# Patient Record
Sex: Male | Born: 1990
Health system: Southern US, Community
[De-identification: ages and names within clinical notes are randomized; demographics above are authoritative.]

## PROBLEM LIST (undated history)

## (undated) DIAGNOSIS — R945 Abnormal results of liver function studies: Secondary | ICD-10-CM

## (undated) DIAGNOSIS — F32A Depression, unspecified: Secondary | ICD-10-CM

## (undated) DIAGNOSIS — R7989 Other specified abnormal findings of blood chemistry: Secondary | ICD-10-CM

## (undated) DIAGNOSIS — F329 Major depressive disorder, single episode, unspecified: Secondary | ICD-10-CM

## (undated) HISTORY — PX: SHOULDER ARTHROSCOPY W/ LABRAL REPAIR: SHX2399

## (undated) HISTORY — DX: Abnormal results of liver function studies: R94.5

## (undated) HISTORY — DX: Depression, unspecified: F32.A

## (undated) HISTORY — PX: WISDOM TOOTH EXTRACTION: SHX21

## (undated) HISTORY — DX: Major depressive disorder, single episode, unspecified: F32.9

## (undated) HISTORY — DX: Other specified abnormal findings of blood chemistry: R79.89

---
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
## 2012-10-12 ENCOUNTER — Other Ambulatory Visit: Payer: Self-pay | Admitting: Nurse Practitioner

## 2012-10-15 NOTE — Telephone Encounter (Signed)
Last seen 07/13

## 2012-12-06 ENCOUNTER — Ambulatory Visit (INDEPENDENT_AMBULATORY_CARE_PROVIDER_SITE_OTHER): Payer: 59 | Admitting: Nurse Practitioner

## 2012-12-06 ENCOUNTER — Encounter: Payer: Self-pay | Admitting: Nurse Practitioner

## 2012-12-06 VITALS — BP 148/99 | HR 92 | Temp 97.5°F | Ht 70.0 in | Wt 263.0 lb

## 2012-12-06 DIAGNOSIS — I10 Essential (primary) hypertension: Secondary | ICD-10-CM

## 2012-12-06 DIAGNOSIS — Z0000 Encounter for general adult medical examination without abnormal findings: Secondary | ICD-10-CM

## 2012-12-06 DIAGNOSIS — F32A Depression, unspecified: Secondary | ICD-10-CM

## 2012-12-06 DIAGNOSIS — F329 Major depressive disorder, single episode, unspecified: Secondary | ICD-10-CM

## 2012-12-06 MED ORDER — SERTRALINE HCL 100 MG PO TABS: 100.0000 mg | ORAL_TABLET | Freq: Every day | ORAL | Status: DC

## 2012-12-06 MED ORDER — HYDROCHLOROTHIAZIDE 12.5 MG PO CAPS: 12.5000 mg | ORAL_CAPSULE | Freq: Every day | ORAL | Status: DC

## 2012-12-06 NOTE — Progress Notes (Signed)
  Subjective:    Patient ID: Michael Roman, male    DOB: [birth year], 22 y.o.   MRN: [id number]  HPI  Patient in today for CPE- He is doing quite well- Blood pressure slightly elevated and he says it has been high the last couple of times that he checked it.    Review of Systems  Constitutional: Negative for fever, activity change, appetite change and fatigue.  HENT: Negative.   Eyes: Negative.   Respiratory: Negative for cough, chest tightness and shortness of breath.   Cardiovascular: Negative for chest pain, palpitations and leg swelling.  Gastrointestinal: Negative.   Endocrine: Negative.   Genitourinary: Negative.   Skin:       Nevus on face mom wants looked at.  Neurological: Negative.   Hematological: Negative.   Psychiatric/Behavioral: Negative.        Objective:   Physical Exam  Constitutional: He is oriented to person, place, and time. He appears well-developed and well-nourished.  HENT:  Head: Normocephalic.  Right Ear: External ear normal.  Left Ear: External ear normal.  Nose: Nose normal.  Mouth/Throat: Oropharynx is clear and moist.  Eyes: Conjunctivae and EOM are normal. Pupils are equal, round, and reactive to light.  Neck: Normal range of motion. Neck supple. No thyromegaly present.  Cardiovascular: Normal rate, regular rhythm, normal heart sounds and intact distal pulses.   No murmur heard. Pulmonary/Chest: Effort normal and breath sounds normal. He has no wheezes. He has no rales.  Abdominal: Soft. Bowel sounds are normal.  Musculoskeletal: Normal range of motion.  Lymphadenopathy:    He has no cervical adenopathy.  Neurological: He is alert and oriented to person, place, and time.  Skin: Skin is warm and dry.  Small black nevus on right cheek  Psychiatric: He has a normal mood and affect. His behavior is normal. Judgment and thought content normal.    BP 148/99  Pulse 92  Temp(Src) 97.5 F (36.4 C) (Oral)  Ht 5' 10"  (1.778 m)  Wt 263 lb (119.296  kg)  BMI 37.74 kg/m2       Assessment & Plan:  1. Annual physical exam Diet and exercise encouraged  2. Depression Stress management Continue zoloft as rx  3. Hypertension Low NA diet HCTZ 12.5 1 po qd #30 5 refills  Mary-Margaret Hassell Done, FNP

## 2012-12-06 NOTE — Patient Instructions (Addendum)

## 2012-12-08 LAB — NMR, LIPOPROFILE
LDL Particle Number: 2083 nmol/L — ABNORMAL HIGH (ref <1000)
LDL Size: 20.2 nm — ABNORMAL LOW (ref >20.5)
LP-IR Score: 100 — ABNORMAL HIGH (ref <=45)

## 2012-12-08 LAB — CMP14+EGFR
ALT: 94 IU/L — ABNORMAL HIGH (ref 0–44)
AST: 61 IU/L — ABNORMAL HIGH (ref 0–40)
Alkaline Phosphatase: 63 IU/L (ref 39–117)
BUN/Creatinine Ratio: 13 (ref 8–19)
Chloride: 105 mmol/L (ref 97–108)
GFR calc Af Amer: 131 mL/min/{1.73_m2} (ref >59)
Glucose: 89 mg/dL (ref 65–99)
Potassium: 4.3 mmol/L (ref 3.5–5.2)
Sodium: 143 mmol/L (ref 134–144)
Total Bilirubin: 0.4 mg/dL (ref 0.0–1.2)
Total Protein: 7.7 g/dL (ref 6.0–8.5)

## 2013-01-31 ENCOUNTER — Other Ambulatory Visit: Payer: Self-pay | Admitting: Nurse Practitioner

## 2013-01-31 ENCOUNTER — Telehealth: Payer: Self-pay | Admitting: Nurse Practitioner

## 2013-01-31 DIAGNOSIS — F329 Major depressive disorder, single episode, unspecified: Secondary | ICD-10-CM

## 2013-01-31 DIAGNOSIS — F32A Depression, unspecified: Secondary | ICD-10-CM

## 2013-01-31 MED ORDER — SERTRALINE HCL 100 MG PO TABS: 100.0000 mg | ORAL_TABLET | Freq: Every day | ORAL | Status: DC

## 2013-01-31 NOTE — Telephone Encounter (Signed)
Get pharmacy address so i can send eletronic rx there

## 2013-01-31 NOTE — Telephone Encounter (Signed)
rx sent electronically. 

## 2013-01-31 NOTE — Telephone Encounter (Signed)
Sent in [place] 10/06

## 2013-01-31 NOTE — Telephone Encounter (Signed)
[address]

## 2013-03-03 ENCOUNTER — Other Ambulatory Visit: Payer: Self-pay

## 2013-05-11 ENCOUNTER — Other Ambulatory Visit: Payer: Self-pay | Admitting: Nurse Practitioner

## 2013-05-12 ENCOUNTER — Telehealth: Payer: Self-pay | Admitting: Nurse Practitioner

## 2013-05-12 DIAGNOSIS — F32A Depression, unspecified: Secondary | ICD-10-CM

## 2013-05-12 DIAGNOSIS — F329 Major depressive disorder, single episode, unspecified: Secondary | ICD-10-CM

## 2013-05-12 MED ORDER — SERTRALINE HCL 100 MG PO TABS: ORAL_TABLET | ORAL | Status: DC

## 2013-05-12 NOTE — Telephone Encounter (Signed)
rx corrected

## 2014-05-08 ENCOUNTER — Other Ambulatory Visit: Payer: Self-pay | Admitting: Nurse Practitioner

## 2014-05-10 ENCOUNTER — Telehealth: Payer: Self-pay | Admitting: *Deleted

## 2014-05-10 DIAGNOSIS — F32A Depression, unspecified: Secondary | ICD-10-CM

## 2014-05-10 DIAGNOSIS — F329 Major depressive disorder, single episode, unspecified: Secondary | ICD-10-CM

## 2014-05-10 MED ORDER — SERTRALINE HCL 100 MG PO TABS: ORAL_TABLET | ORAL | Status: DC

## 2014-05-10 NOTE — Telephone Encounter (Signed)
Patient is away at college and will need refills to last until he can come in during spring break.  One refill provided and mother will call back when she has his schedule.

## 2014-06-12 ENCOUNTER — Other Ambulatory Visit: Payer: Self-pay | Admitting: Nurse Practitioner

## 2014-07-03 ENCOUNTER — Encounter: Payer: Self-pay | Admitting: Nurse Practitioner

## 2014-07-03 ENCOUNTER — Ambulatory Visit (INDEPENDENT_AMBULATORY_CARE_PROVIDER_SITE_OTHER): Payer: 59 | Admitting: Nurse Practitioner

## 2014-07-03 VITALS — BP 130/86 | HR 94 | Temp 98.0°F | Ht 70.0 in | Wt 254.0 lb

## 2014-07-03 DIAGNOSIS — F411 Generalized anxiety disorder: Secondary | ICD-10-CM | POA: Diagnosis not present

## 2014-07-03 MED ORDER — SERTRALINE HCL 100 MG PO TABS: ORAL_TABLET | ORAL | Status: DC

## 2014-07-03 NOTE — Patient Instructions (Signed)
Generalized Anxiety Disorder Generalized anxiety disorder (GAD) is a mental disorder. It interferes with life functions, including relationships, work, and school. GAD is different from normal anxiety, which everyone experiences at some point in their lives in response to specific life events and activities. Normal anxiety actually helps us prepare for and get through these life events and activities. Normal anxiety goes away after the event or activity is over.  GAD causes anxiety that is not necessarily related to specific events or activities. It also causes excess anxiety in proportion to specific events or activities. The anxiety associated with GAD is also difficult to control. GAD can vary from mild to severe. People with severe GAD can have intense waves of anxiety with physical symptoms (panic attacks).  SYMPTOMS The anxiety and worry associated with GAD are difficult to control. This anxiety and worry are related to many life events and activities and also occur more days than not for 6 months or longer. People with GAD also have three or more of the following symptoms (one or more in children):  Restlessness.   Fatigue.  Difficulty concentrating.   Irritability.  Muscle tension.  Difficulty sleeping or unsatisfying sleep. DIAGNOSIS GAD is diagnosed through an assessment by your health care provider. Your health care provider will ask you questions aboutyour mood,physical symptoms, and events in your life. Your health care provider may ask you about your medical history and use of alcohol or drugs, including prescription medicines. Your health care provider may also do a physical exam and blood tests. Certain medical conditions and the use of certain substances can cause symptoms similar to those associated with GAD. Your health care provider may refer you to a mental health specialist for further evaluation. TREATMENT The following therapies are usually used to treat GAD:    Medication. Antidepressant medication usually is prescribed for long-term daily control. Antianxiety medicines may be added in severe cases, especially when panic attacks occur.   Talk therapy (psychotherapy). Certain types of talk therapy can be helpful in treating GAD by providing support, education, and guidance. A form of talk therapy called cognitive behavioral therapy can teach you healthy ways to think about and react to daily life events and activities.  Stress managementtechniques. These include yoga, meditation, and exercise and can be very helpful when they are practiced regularly. A mental health specialist can help determine which treatment is best for you. Some people see improvement with one therapy. However, other people require a combination of therapies. Document Released: 08/09/2012 Document Revised: 08/29/2013 Document Reviewed: 08/09/2012 ExitCare Patient Information 2015 [employer]. This information is not intended to replace advice given to you by your health care provider. Make sure you discuss any questions you have with your health care provider.  

## 2014-07-03 NOTE — Progress Notes (Signed)
   Subjective:    Patient ID: Michael Roman, male    DOB: 11/04/1990, 24 y.o.   MRN: [id number]  HPI Patient is here for anxiety follow up. He is currently taking zoloft 116m daily and is doing well. No side effects reported.    Review of Systems  Constitutional: Negative.   HENT: Negative.   Eyes: Negative.   Respiratory: Negative.   Cardiovascular: Negative.   Gastrointestinal: Negative.   Endocrine: Negative.   Genitourinary: Negative.   Musculoskeletal: Negative.   Skin: Negative.   Allergic/Immunologic: Negative.   Neurological: Negative.   Hematological: Negative.   Psychiatric/Behavioral: Negative.        Objective:   Physical Exam  Constitutional: He is oriented to person, place, and time. He appears well-developed.  HENT:  Head: Normocephalic.  Eyes: Pupils are equal, round, and reactive to light.  Neck: Normal range of motion.  Cardiovascular: Normal rate.   Pulmonary/Chest: Effort normal.  Musculoskeletal: Normal range of motion.  Neurological: He is alert and oriented to person, place, and time.  Skin: Skin is warm.  Psychiatric: He has a normal mood and affect.    BP 130/86 mmHg  Pulse 94  Temp(Src) 98 F (36.7 C) (Oral)  Ht 5' 10"  (1.778 m)  Wt 254 lb (115.214 kg)  BMI 36.45 kg/m2       Assessment & Plan:   1. Generalized anxiety disorder    Meds ordered this encounter  Medications  . sertraline (ZOLOFT) 100 MG tablet    Sig: TAKE 1 & 1/2 TABLETS ONCE DAILY    Dispense:  45 tablet    Refill:  3    Needs to be seen. Has appt    Order Specific Question:  Supervising Provider    Answer:  MChipper Herb[[id number]]   Stress management Diet and exercise   Mary-Margaret MHassell Done FNP

## 2014-11-14 ENCOUNTER — Other Ambulatory Visit: Payer: Self-pay | Admitting: Nurse Practitioner

## 2014-12-11 ENCOUNTER — Other Ambulatory Visit: Payer: Self-pay | Admitting: Nurse Practitioner

## 2015-01-16 ENCOUNTER — Other Ambulatory Visit: Payer: Self-pay | Admitting: Nurse Practitioner

## 2015-01-16 NOTE — Telephone Encounter (Signed)
Last seen 07/03/14 MMM

## 2015-02-21 ENCOUNTER — Other Ambulatory Visit: Payer: Self-pay | Admitting: Nurse Practitioner

## 2015-02-21 NOTE — Telephone Encounter (Signed)
Last seen 07/03/14 MMM

## 2015-03-31 ENCOUNTER — Other Ambulatory Visit: Payer: Self-pay | Admitting: Nurse Practitioner

## 2015-04-02 NOTE — Telephone Encounter (Signed)
Last seen 07/03/14  MMM

## 2015-05-11 ENCOUNTER — Other Ambulatory Visit: Payer: Self-pay | Admitting: Nurse Practitioner

## 2015-07-06 ENCOUNTER — Other Ambulatory Visit: Payer: Self-pay | Admitting: Nurse Practitioner

## 2015-07-06 NOTE — Telephone Encounter (Signed)
Been a year since last seen

## 2015-07-16 ENCOUNTER — Ambulatory Visit (INDEPENDENT_AMBULATORY_CARE_PROVIDER_SITE_OTHER): Payer: 59 | Admitting: Family Medicine

## 2015-07-16 ENCOUNTER — Encounter: Payer: Self-pay | Admitting: Family Medicine

## 2015-07-16 VITALS — BP 140/88 | HR 87 | Temp 99.7°F | Ht 70.0 in | Wt 258.0 lb

## 2015-07-16 DIAGNOSIS — F410 Panic disorder [episodic paroxysmal anxiety] without agoraphobia: Secondary | ICD-10-CM | POA: Diagnosis not present

## 2015-07-16 MED ORDER — HYDROXYZINE HCL 25 MG PO TABS: 25.0000 mg | ORAL_TABLET | Freq: Three times a day (TID) | ORAL | Status: DC | PRN

## 2015-07-16 NOTE — Progress Notes (Signed)
BP 140/88 mmHg  Pulse 87  Temp(Src) 99.7 F (37.6 C) (Oral)  Ht 5' 10"  (1.778 m)  Wt 258 lb (117.028 kg)  BMI 37.02 kg/m2   Subjective:    Patient ID: Michael Roman, male    DOB: 1991/01/09, 25 y.o.   MRN: [id number]  HPI: Michael Roman is a 25 y.o. male presenting on 07/16/2015 for Anxiety   HPI Panic attack Patient comes in today because of his anxiety caused him to have a panic attack. 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 The test did not go well and last night he had a lot of anxiety associated with that and it built up to where he had a panic attack overnight last night and into this morning. He says also hasn't helped because his parents been out of town this weekend so he has more time to be inside his own head. He says it is been years since he has had a panic attack like this. He has been steady on the Zoloft for quite a while. He denies any suicidal ideations or thoughts of hurting himself. He is feeling better now but is just concerned about having another panic attack. His parents do get back tomorrow.  Relevant past medical, surgical, family and social history reviewed and updated as indicated. Interim medical history since our last visit reviewed. Allergies and medications reviewed and updated.  Review of Systems  Constitutional: Negative for fever.  HENT: Negative for ear discharge and ear pain.   Eyes: Negative for discharge and visual disturbance.  Respiratory: Positive for chest tightness. Negative for shortness of breath and wheezing.   Cardiovascular: Positive for palpitations. Negative for chest pain and leg swelling.  Gastrointestinal: Negative for abdominal pain, diarrhea and constipation.  Genitourinary: Negative for difficulty urinating.  Musculoskeletal: Negative for back pain and gait problem.  Skin: Negative for rash.  Neurological: Positive for dizziness and headaches. Negative for syncope and  light-headedness.  All other systems reviewed and are negative.   Per HPI unless specifically indicated above     Medication List       This list is accurate as of: 07/16/15  4:35 PM.  Always use your most recent med list.               hydrochlorothiazide 12.5 MG capsule  Commonly known as:  MICROZIDE  Take 1 capsule (12.5 mg total) by mouth daily.     hydrOXYzine 25 MG tablet  Commonly known as:  ATARAX/VISTARIL  Take 1 tablet (25 mg total) by mouth 3 (three) times daily as needed.     sertraline 100 MG tablet  Commonly known as:  ZOLOFT  TAKE 1 & 1/2 TABLETS ONCE DAILY           Objective:    BP 140/88 mmHg  Pulse 87  Temp(Src) 99.7 F (37.6 C) (Oral)  Ht 5' 10"  (1.778 m)  Wt 258 lb (117.028 kg)  BMI 37.02 kg/m2  Wt Readings from Last 3 Encounters:  07/16/15 258 lb (117.028 kg)  07/03/14 254 lb (115.214 kg)  12/06/12 263 lb (119.296 kg)    Physical Exam  Constitutional: He is oriented to person, place, and time. He appears well-developed and well-nourished. No distress.  HENT:  Right Ear: External ear normal.  Left Ear: External ear normal.  Nose: Nose normal.  Mouth/Throat: Oropharynx is clear and moist. No oropharyngeal exudate.  Eyes: Conjunctivae and EOM are normal. Pupils are  equal, round, and reactive to light. Right eye exhibits no discharge. No scleral icterus.  Neck: Neck supple. No thyromegaly present.  Cardiovascular: Normal rate, regular rhythm, normal heart sounds and intact distal pulses.   No murmur heard. Pulmonary/Chest: Effort normal and breath sounds normal. No respiratory distress. He has no wheezes.  Musculoskeletal: Normal range of motion. He exhibits no edema.  Lymphadenopathy:    He has no cervical adenopathy.  Neurological: He is alert and oriented to person, place, and time. No cranial nerve deficit. Coordination normal.  Skin: Skin is warm and dry. No rash noted. He is not diaphoretic.  Psychiatric: He has a normal mood and  affect. His behavior is normal.  Vitals reviewed.      Assessment & Plan:       Problem List Items Addressed This Visit    None    Visit Diagnoses    Panic attack    -  Primary    Patient has had stable anxiety for quite a few years, had a panic attack yesterday, gave Vistaril and have follow-up with PCP    Relevant Medications    hydrOXYzine (ATARAX/VISTARIL) 25 MG tablet        Follow up plan: Return in about 4 weeks (around 08/13/2015), or if symptoms worsen or fail to improve, for Follow-up anxiety.  Counseling provided for all of the vaccine components No orders of the defined types were placed in this encounter.    Caryl Pina, MD [facility] 07/16/2015, 4:35 PM

## 2015-07-20 ENCOUNTER — Ambulatory Visit (INDEPENDENT_AMBULATORY_CARE_PROVIDER_SITE_OTHER): Payer: 59 | Admitting: Psychology

## 2015-08-03 ENCOUNTER — Ambulatory Visit (HOSPITAL_COMMUNITY): Payer: Self-pay | Admitting: Psychology

## 2015-08-14 ENCOUNTER — Other Ambulatory Visit: Payer: Self-pay | Admitting: Nurse Practitioner

## 2015-08-15 ENCOUNTER — Ambulatory Visit (INDEPENDENT_AMBULATORY_CARE_PROVIDER_SITE_OTHER): Payer: 59 | Admitting: Psychology

## 2015-09-03 ENCOUNTER — Ambulatory Visit (INDEPENDENT_AMBULATORY_CARE_PROVIDER_SITE_OTHER): Payer: 59 | Admitting: Family Medicine

## 2015-09-03 ENCOUNTER — Encounter: Payer: Self-pay | Admitting: Family Medicine

## 2015-09-03 VITALS — BP 137/96 | HR 96 | Temp 97.8°F | Ht 70.0 in | Wt 255.4 lb

## 2015-09-03 DIAGNOSIS — Z0000 Encounter for general adult medical examination without abnormal findings: Secondary | ICD-10-CM

## 2015-09-03 NOTE — Progress Notes (Signed)
   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  Patient explains that he feels well. 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  He has no complaints. He is nervous about the exam today and states that he gets nervous every time he comes to the doctor.  He is doing well from a generalized anxiety disorder perspective, denies suicidal ideation and has good medication compliance.  He is not taking his antihypertensives, he denies any chest pain, headache, palpitations, or leg edema  PMH: Smoking status noted ROS: Per HPI  Objective: BP 137/96 mmHg  Pulse 96  Temp(Src) 97.8 F (36.6 C) (Oral)  Ht 5' 10"  (1.778 m)  Wt 255 lb 6.4 oz (115.849 kg)  BMI 36.65 kg/m2 Gen: NAD, alert, cooperative with exam HEENT: NCAT, EOMI, PERRL, nares clear, TMs normal, oropharynx clear CV: RRR, good S1/S2, no murmur Resp: CTABL, no wheezes, non-labored Abd: SNTND, BS present, no guarding or organomegaly Ext: No edema, warm Neuro: Alert and oriented, 5/5 and sensation intact in bilateral lower and upper extremities, 2+ patellar tendon reflexes  Musculoskeletal Normal range of motion of neck, shoulders, elbows, hips, knees, and ankles  Assessment and plan:  # Annual physical exam, 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 09/03/2015, 4:13 PM

## 2015-09-03 NOTE — Patient Instructions (Signed)
Great to meet you!  Come back as previously planned for your anxiety.

## 2015-09-05 ENCOUNTER — Ambulatory Visit (HOSPITAL_COMMUNITY): Payer: Self-pay | Admitting: Psychology

## 2015-10-04 ENCOUNTER — Ambulatory Visit (INDEPENDENT_AMBULATORY_CARE_PROVIDER_SITE_OTHER): Payer: 59 | Admitting: Family Medicine

## 2015-10-04 ENCOUNTER — Encounter: Payer: Self-pay | Admitting: Family Medicine

## 2015-10-04 VITALS — BP 148/87 | HR 94 | Temp 96.3°F | Ht 70.0 in | Wt 258.4 lb

## 2015-10-04 DIAGNOSIS — F411 Generalized anxiety disorder: Secondary | ICD-10-CM

## 2015-10-04 MED ORDER — SERTRALINE HCL 100 MG PO TABS: ORAL_TABLET | ORAL | Status: DC

## 2015-10-04 NOTE — Progress Notes (Signed)
   HPI  Patient presents today for follow-up for anxiety.  Patient states that he is doing very well and has no complaints.  He denies feelings of depression, anhedonia, and anxious feelings.  He's been doing very well on Zoloft for several years.  He takes 150 mg daily.  He denies any shortness of breath, chest pain, upset stomach. He has no problems concentrating or sleeping. 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 sertraline (ZOLOFT) 100 MG tablet    Sig: TAKE 1 & 1/2 TABLETS ONCE DAILY    Dispense:  45 tablet    Refill:  [place], MD [facility] 10/04/2015, 1:11 PM

## 2015-10-04 NOTE — Patient Instructions (Signed)
Great to see you!  I have sent in 6 months worth of meds, Lets plan to follow up in December.   Please let us know if you need anything at all.

## 2016-02-22 ENCOUNTER — Telehealth: Payer: 59 | Admitting: Nurse Practitioner

## 2016-02-22 DIAGNOSIS — J029 Acute pharyngitis, unspecified: Secondary | ICD-10-CM

## 2016-02-22 NOTE — Progress Notes (Signed)
We are sorry that you are not feeling well.  Here is how we plan to help!  Based on what you have shared with me it looks like you have viral pharyngitis.  viral pharyngitis is inflammation and infection in the back of the throat.  This is an infection caused by a virus and is treated symptomatically.. You may use an oral throat lozenges as needed. Viral pharyngitis are not as easily transmitted as other respiratory infection, however we still recommend that you avoid close contact with loved ones, especially the very young and elderly.  Remember to wash your hands thoroughly throughout the day as this is the number one way to prevent the spread of infection!  Home Care: Only take medications as instructed by your medical team. Complete the entire course of an antibiotic. Do not take these medications with alcohol. A steam or ultrasonic humidifier can help congestion.  You can place a towel over your head and breathe in the steam from hot water coming from a faucet. Avoid close contacts especially the very young and the elderly. Cover your mouth when you cough or sneeze. Always remember to wash your hands.  Get Help Right Away If: You develop worsening fever or sinus pain. You develop a severe head ache or visual changes. Your symptoms persist after you have completed your treatment plan.  Make sure you Understand these instructions. Will watch your condition. Will get help right away if you are not doing well or get worse.  Your e-visit answers were reviewed by a board certified advanced clinical practitioner to complete your personal care plan.  Depending on the condition, your plan could have included both over the counter or prescription medications.  If there is a problem please reply  once you have received a response from your provider.  Your safety is important to [place].  If you have drug allergies check your prescription carefully.    You can use MyChart to ask questions about  today's visit, request a non-urgent call back, or ask for a work or school excuse for 24 hours related to this e-Visit. If it has been greater than 24 hours you will need to follow up with your provider, or enter a new e-Visit to address those concerns.  You will get an e-mail in the next two days asking about your experience.  I hope that your e-visit has been valuable and will speed your recovery. Thank you for using e-visits.

## 2016-04-15 ENCOUNTER — Other Ambulatory Visit: Payer: Self-pay | Admitting: Family Medicine

## 2016-05-09 ENCOUNTER — Other Ambulatory Visit: Payer: Self-pay | Admitting: Nurse Practitioner

## 2016-06-20 ENCOUNTER — Other Ambulatory Visit: Payer: Self-pay | Admitting: Nurse Practitioner

## 2016-06-21 ENCOUNTER — Other Ambulatory Visit: Payer: Self-pay | Admitting: *Deleted

## 2016-06-21 MED ORDER — SERTRALINE HCL 100 MG PO TABS: ORAL_TABLET | ORAL | 0 refills | Status: DC

## 2016-06-21 NOTE — Progress Notes (Signed)
Pt requesting refill on Zoloft Sent in 1 mth refill appt scheduled for follow up with Dr Wendi Snipes

## 2016-06-23 ENCOUNTER — Ambulatory Visit: Payer: 59 | Admitting: Family Medicine

## 2016-06-24 ENCOUNTER — Telehealth: Payer: Self-pay | Admitting: Nurse Practitioner

## 2016-06-24 ENCOUNTER — Encounter: Payer: Self-pay | Admitting: Nurse Practitioner

## 2016-06-24 ENCOUNTER — Other Ambulatory Visit: Payer: Self-pay | Admitting: Nurse Practitioner

## 2016-06-24 NOTE — Telephone Encounter (Signed)
Left message for patient to call back to reschedule missed appointment.  

## 2016-07-03 ENCOUNTER — Ambulatory Visit (INDEPENDENT_AMBULATORY_CARE_PROVIDER_SITE_OTHER): Payer: BLUE CROSS/BLUE SHIELD | Admitting: Family Medicine

## 2016-07-03 ENCOUNTER — Encounter: Payer: Self-pay | Admitting: Family Medicine

## 2016-07-03 VITALS — BP 139/93 | HR 100 | Temp 97.5°F | Ht 70.0 in | Wt 260.8 lb

## 2016-07-03 DIAGNOSIS — F411 Generalized anxiety disorder: Secondary | ICD-10-CM

## 2016-07-03 DIAGNOSIS — R21 Rash and other nonspecific skin eruption: Secondary | ICD-10-CM

## 2016-07-03 DIAGNOSIS — E785 Hyperlipidemia, unspecified: Secondary | ICD-10-CM | POA: Diagnosis not present

## 2016-07-03 DIAGNOSIS — I10 Essential (primary) hypertension: Secondary | ICD-10-CM | POA: Diagnosis not present

## 2016-07-03 MED ORDER — KETOCONAZOLE 2 % EX CREA: 1.0000 "application " | TOPICAL_CREAM | Freq: Every day | CUTANEOUS | 2 refills | Status: DC

## 2016-07-03 MED ORDER — SERTRALINE HCL 100 MG PO TABS: ORAL_TABLET | ORAL | 3 refills | Status: DC

## 2016-07-03 NOTE — Patient Instructions (Signed)
Great to see you!  Lets follow up in 6 month sunless you need [place] sooner.   We will call with labs or send then to [place] within 1 week.

## 2016-07-03 NOTE — Progress Notes (Signed)
   HPI  Patient presents today to follow-up for chronic medical conditions.  Anxiety Well-controlled with 150 mg Zoloft daily Denies suicidal thoughts or uncontrolled symptoms.  Hyperlipidemia Patient recently with very high cholesterol, at the time he states he had a very poor lifestyle in college with unhealthy eating habits and elevated alcohol use. He does not have regular exercise currently.  Elevated blood pressure Previously treated with HCTZ, however he states his blood pressures usually better whenever he is not a doctor's office.   PMH: Smoking status noted ROS: Per HPI  Objective: BP (!) 139/93   Pulse 100   Temp 97.5 F (36.4 C) (Oral)   Ht _0  (1.778 m)   Wt 260 lb 12.8 oz (118.3 kg)   BMI 37.42 kg/m  Gen: NAD, alert, cooperative with exam HEENT: NCAT, EOMI, PERRL CV: RRR, good S1/S2, no murmur Resp: CTABL, no wheezes, non-labored Abd: SNTND, BS present, no guarding or organomegaly Ext: No edema, warm Neuro: Alert and oriented, No gross deficits  Skin 10-15 slightly erythematous to pink finely scaled coin shaped lesions on the bilateral forearms and left lower leg  Assessment and plan:  # Generalized anxiety disorder Doing well, refilled Zoloft- stable  # Elevated blood pressure without diagnosis of hypertension Blood pressure log, follow-up as needed  # Hyperlipidemia Clinically improved, recheck labs some concern with elevated liver enzymes  # Rash Likely tinea corporis, improved with terbinafine orally from urgent care ketoconazole ordered, would be glad to do another course of terbinafine but I like to verify normal liver enzymes first    Orders Placed This Encounter  Procedures  . Lipid panel  . CMP14+EGFR  . CBC with Differential/Platelet  . TSH    Meds ordered this encounter  Medications  . sertraline (ZOLOFT) 100 MG tablet    Sig: TAKE 1 & 1/2 TABLETS ONCE DAILY    Dispense:  135 tablet    Refill:  [place],  MD [place] 07/03/2016, 8:52 AM

## 2016-07-04 ENCOUNTER — Other Ambulatory Visit: Payer: Self-pay

## 2016-07-04 DIAGNOSIS — R748 Abnormal levels of other serum enzymes: Secondary | ICD-10-CM

## 2016-07-04 LAB — LIPID PANEL
CHOL/HDL RATIO: 6.2 ratio — AB (ref 0.0–5.0)
Cholesterol, Total: 185 mg/dL (ref 100–199)
HDL: 30 mg/dL — ABNORMAL LOW (ref >39)
LDL CALC: 111 mg/dL — AB (ref 0–99)
Triglycerides: 218 mg/dL — ABNORMAL HIGH (ref 0–149)
VLDL CHOLESTEROL CAL: 44 mg/dL — AB (ref 5–40)

## 2016-07-04 LAB — CBC WITH DIFFERENTIAL/PLATELET
Basophils Absolute: 0.1 10*3/uL (ref 0.0–0.2)
Basos: 1 %
EOS (ABSOLUTE): 0.3 10*3/uL (ref 0.0–0.4)
EOS: 5 %
HEMATOCRIT: 44.9 % (ref 37.5–51.0)
Hemoglobin: 15.9 g/dL (ref 13.0–17.7)
Immature Grans (Abs): 0 10*3/uL (ref 0.0–0.1)
Immature Granulocytes: 0 %
LYMPHS ABS: 2.9 10*3/uL (ref 0.7–3.1)
Lymphs: 38 %
MCH: 30.4 pg (ref 26.6–33.0)
MCHC: 35.4 g/dL (ref 31.5–35.7)
MCV: 86 fL (ref 79–97)
MONOCYTES: 7 %
MONOS ABS: 0.5 10*3/uL (ref 0.1–0.9)
Neutrophils Absolute: 3.7 10*3/uL (ref 1.4–7.0)
Neutrophils: 49 %
Platelets: 312 10*3/uL (ref 150–379)
RBC: 5.23 x10E6/uL (ref 4.14–5.80)
RDW: 14.1 % (ref 12.3–15.4)
WBC: 7.5 10*3/uL (ref 3.4–10.8)

## 2016-07-04 LAB — CMP14+EGFR
ALK PHOS: 53 IU/L (ref 39–117)
ALT: 71 IU/L — ABNORMAL HIGH (ref 0–44)
AST: 42 IU/L — AB (ref 0–40)
Albumin/Globulin Ratio: 1.6 (ref 1.2–2.2)
Albumin: 4.9 g/dL (ref 3.5–5.5)
BILIRUBIN TOTAL: 0.4 mg/dL (ref 0.0–1.2)
BUN / CREAT RATIO: 12 (ref 9–20)
BUN: 12 mg/dL (ref 6–20)
CHLORIDE: 103 mmol/L (ref 96–106)
CO2: 25 mmol/L (ref 18–29)
CREATININE: 1 mg/dL (ref 0.76–1.27)
Calcium: 9.8 mg/dL (ref 8.7–10.2)
GFR calc Af Amer: 122 mL/min/{1.73_m2} (ref >59)
GFR calc non Af Amer: 106 mL/min/{1.73_m2} (ref >59)
GLUCOSE: 108 mg/dL — AB (ref 65–99)
Globulin, Total: 3.1 g/dL (ref 1.5–4.5)
Potassium: 5.1 mmol/L (ref 3.5–5.2)
Sodium: 144 mmol/L (ref 134–144)
Total Protein: 8 g/dL (ref 6.0–8.5)

## 2016-07-04 LAB — TSH: TSH: 2.03 u[IU]/mL (ref 0.450–4.500)

## 2016-07-10 ENCOUNTER — Encounter: Payer: Self-pay | Admitting: *Deleted

## 2016-07-11 ENCOUNTER — Ambulatory Visit (HOSPITAL_COMMUNITY)
Admission: RE | Admit: 2016-07-11 | Discharge: 2016-07-11 | Disposition: A | Discharge status: Home / Self Care | Payer: BLUE CROSS/BLUE SHIELD | Source: Ambulatory Visit | Attending: Family Medicine | Admitting: Family Medicine

## 2016-07-11 DIAGNOSIS — K824 Cholesterolosis of gallbladder: Secondary | ICD-10-CM | POA: Diagnosis not present

## 2016-07-11 DIAGNOSIS — R748 Abnormal levels of other serum enzymes: Secondary | ICD-10-CM | POA: Insufficient documentation

## 2016-07-11 DIAGNOSIS — K769 Liver disease, unspecified: Secondary | ICD-10-CM | POA: Diagnosis not present

## 2016-07-11 DIAGNOSIS — Q8909 Congenital malformations of spleen: Secondary | ICD-10-CM | POA: Insufficient documentation

## 2016-08-08 ENCOUNTER — Ambulatory Visit: Payer: BLUE CROSS/BLUE SHIELD | Admitting: Family Medicine

## 2016-08-11 ENCOUNTER — Ambulatory Visit: Payer: BLUE CROSS/BLUE SHIELD | Admitting: Family Medicine

## 2016-08-12 ENCOUNTER — Ambulatory Visit (INDEPENDENT_AMBULATORY_CARE_PROVIDER_SITE_OTHER): Payer: BLUE CROSS/BLUE SHIELD | Admitting: Family Medicine

## 2016-08-12 ENCOUNTER — Encounter: Payer: Self-pay | Admitting: Family Medicine

## 2016-08-12 VITALS — BP 145/99 | HR 91 | Temp 97.3°F | Ht 70.0 in | Wt 255.8 lb

## 2016-08-12 DIAGNOSIS — K824 Cholesterolosis of gallbladder: Secondary | ICD-10-CM | POA: Diagnosis not present

## 2016-08-12 DIAGNOSIS — K760 Fatty (change of) liver, not elsewhere classified: Secondary | ICD-10-CM | POA: Diagnosis not present

## 2016-08-12 NOTE — Patient Instructions (Signed)
Great to see you!  Please call with the GI doctor you would like to see, we will work on a referral then.   We will call with lab results within 1 week.

## 2016-08-12 NOTE — Progress Notes (Signed)
   HPI  Patient presents today for follow-up for hepatic steatosis and gallbladder polyp.  Patient had elevated liver enzymes consistently for 3 years. Ultrasound was performed showing hepatic steatosis and 7 mm gallbladder polyp. Also showed concern for cholesterolosis or adenomyomatosis.   Pt states that he eats fast food on a daily basis. He does not drink alcohol routinely. He is not exercising regularly.   PMH: Smoking status noted ROS: Per HPI  Objective: BP (!) 145/99   Pulse 91   Temp 97.3 F (36.3 C) (Oral)   Ht 5' 10"  (1.778 m)   Wt 255 lb 12.8 oz (116 kg)   BMI 36.70 kg/m  Gen: NAD, alert, cooperative with exam HEENT: NCAT CV: RRR, good S1/S2, no murmur Resp: CTABL, no wheezes, non-labored Ext: No edema, warm Neuro: Alert and oriented  Assessment and plan:  # Hepatic steatosis Repeat labs, some mildly elevated LFTs persistently. Recently diagnosed with ultrasound Recommended aggressive therapeutic lifestyle changes  # Gallbladder polyps Reviewed polyps likely benign, however follow-up in one year as recommended by radiology. With cholesterolosis or adenomyomatosis, he gets appropriate to discuss the findings with a GI doctor for their opinion on additional possible workup for additional monitoring if needed. The patient that these are only possible conditions and may not warrant any additional workup at all.   Orders Placed This Encounter  Procedures  . Hepatic function panel     Laroy Apple, MD [facility] 08/12/2016, 5:15 PM

## 2016-08-13 ENCOUNTER — Other Ambulatory Visit: Payer: Self-pay | Admitting: *Deleted

## 2016-08-13 ENCOUNTER — Encounter: Payer: Self-pay | Admitting: Family Medicine

## 2016-08-13 DIAGNOSIS — E785 Hyperlipidemia, unspecified: Secondary | ICD-10-CM

## 2016-08-13 LAB — HEPATIC FUNCTION PANEL
ALT: 67 IU/L — AB (ref 0–44)
AST: 47 IU/L — AB (ref 0–40)
Albumin: 5 g/dL (ref 3.5–5.5)
Alkaline Phosphatase: 49 IU/L (ref 39–117)
Bilirubin Total: 0.5 mg/dL (ref 0.0–1.2)
Bilirubin, Direct: 0.15 mg/dL (ref 0.00–0.40)
Total Protein: 8.1 g/dL (ref 6.0–8.5)

## 2016-08-14 ENCOUNTER — Encounter: Payer: Self-pay | Admitting: Gastroenterology

## 2016-09-17 ENCOUNTER — Encounter: Payer: Self-pay | Admitting: Gastroenterology

## 2016-09-17 ENCOUNTER — Ambulatory Visit (INDEPENDENT_AMBULATORY_CARE_PROVIDER_SITE_OTHER): Payer: BLUE CROSS/BLUE SHIELD | Admitting: Gastroenterology

## 2016-09-17 ENCOUNTER — Other Ambulatory Visit: Payer: BLUE CROSS/BLUE SHIELD

## 2016-09-17 VITALS — BP 118/80 | HR 116 | Ht 69.0 in | Wt 252.0 lb

## 2016-09-17 DIAGNOSIS — K824 Cholesterolosis of gallbladder: Secondary | ICD-10-CM

## 2016-09-17 DIAGNOSIS — R7989 Other specified abnormal findings of blood chemistry: Secondary | ICD-10-CM

## 2016-09-17 DIAGNOSIS — R945 Abnormal results of liver function studies: Secondary | ICD-10-CM

## 2016-09-17 DIAGNOSIS — E782 Mixed hyperlipidemia: Secondary | ICD-10-CM | POA: Diagnosis not present

## 2016-09-17 NOTE — Patient Instructions (Signed)
You will go to the basement for labs today  You will need a follow up Ultrasound and labs in 6 months, we will contact you when that is due

## 2016-09-17 NOTE — Progress Notes (Signed)
Michael Roman    [id number]    Mar 20, 1991  Primary Care Physician:Bradshaw, Sherley Bounds, MD  Referring Physician: Timmothy Euler, MD [place], [place] [id number]  Chief complaint:  Abnormal LFT, gallbladder polyp  HPI:  26 year old male with history of hypercholesterolemia here for new patient visit. On routine labs he was noted to have elevated AST was 42 and ALT 71 with normal total bilirubin 0.4, alkaline phosphatase 53. He has elevated triglycerides 218, LDL 111, HDL 30 and total cholesterol 185. Rest of the labs were unremarkable. Abdominal ultrasound showed finding of 7 mm gallbladder polyp, also had a foci of gallbladder wall inflammation and thickening could be adenomyomatosis or cholesterolosis. He has no complaints, denies any abdominal pain nausea, vomiting, heartburn, dysphagia, odynophagia, change in bowel habits, melena or blood per rectum. No family history of celiac disease, IBD or GI malignancy. Denies any over-the-counter meds or excessive use of alcohol. He drinks socially once or twice a month, about 3-4 drinks. He is on Zoloft daily and has been taking it for the past 12 years for anxiety He changed his diet and is trying to exercise, he lost about 10 pounds in the past 1 month with the changes he made   Outpatient Encounter Prescriptions as of 09/17/2016  Medication Sig  . sertraline (ZOLOFT) 100 MG tablet TAKE 1 & 1/2 TABLETS ONCE DAILY  . ketoconazole (NIZORAL) 2 % cream Apply 1 application topically daily. (Patient not taking: Reported on 09/17/2016)   No facility-administered encounter medications on file as of 09/17/2016.     Allergies as of 09/17/2016 - Review Complete 09/17/2016  Allergen Reaction Noted  . Vantin [cefpodoxime]  12/06/2012    Past Medical History:  Diagnosis Date  . Depression     Past Surgical History:  Procedure Laterality Date  . SHOULDER ARTHROSCOPY W/ LABRAL REPAIR     x2  . WISDOM TOOTH EXTRACTION       Family History  Problem Relation Age of Onset  . Hypertension Mother   . Hypertension Father   . Colon cancer Neg Hx   . Stomach cancer Neg Hx     Social History   Social History  . Marital status: Single    Spouse name: N/A  . Number of children: N/A  . Years of education: N/A   Occupational History  . fire sprinkler     Social History Main Topics  . Smoking status: Never Smoker  . Smokeless tobacco: Never Used  . Alcohol use No  . Drug use: No  . Sexual activity: Not Currently   Other Topics Concern  . Not on file   Social History Narrative  . No narrative on file      Review of systems: Review of Systems  Constitutional: Negative for fever and chills.  HENT: Negative.   Eyes: Negative for blurred vision.  Respiratory: Negative for cough, shortness of breath and wheezing.   Cardiovascular: Negative for chest pain and palpitations.  Gastrointestinal: as per HPI Genitourinary: Negative for dysuria, urgency, frequency and hematuria.  Musculoskeletal: Negative for myalgias, back pain and joint pain.  Skin: Negative for itching and rash.  Neurological: Negative for dizziness, tremors, focal weakness, seizures and loss of consciousness.  Endo/Heme/Allergies: Negative for seasonal allergies.  Psychiatric/Behavioral: Negative for depression, suicidal ideas and hallucinations.  positive for anxiety All other systems reviewed and are negative.   Physical Exam: Vitals:   09/17/16 1022  BP: 118/80  Pulse: (!) 116   Body mass index is 37.21 kg/m. Gen:      No acute distress HEENT:  EOMI, sclera anicteric Neck:     No masses; no thyromegaly Lungs:    Clear to auscultation bilaterally; normal respiratory effort CV:         Regular rate and rhythm; no murmurs Abd:      + bowel sounds; soft, non-tender; no palpable masses, no distension Ext:    No edema; adequate peripheral perfusion Skin:      Warm and dry; no rash Neuro: alert and oriented x 3 Psych:  normal mood and affect  Data Reviewed:  Reviewed labs, radiology imaging, old records and pertinent past GI work up   Assessment and Plan/Recommendations:  26 year old male with history of hyper triglyceridemia and cholesterolemia with elevated transaminases and gallbladder polyp  7 mm gallbladder polyp and possible adenomyomatosis or cholesterolosis We'll schedule for follow-up ultrasound in 6 months, if stable can recheck in 1 year  Elevated AST and ALT: LFT abnormality pattern is consistent with steatohepatitis but will need to exclude viral hepatitis, autoimmune hepatitis or any underlying liver disease Check hep B, hep A, hep C, ANA, AMA, alpha-1 antitrypsin, ceruloplasmin and anti-smooth muscle antibody Recheck LFT in 6 months Advised patient to limit alcohol intake Avoid nsaids Avoid simple carbohydrates and sugars Continue with high protein diet and exercise with goal 10% body weight loss  Return in 6 months or sooner if needed  Damaris Hippo , MD [phone] Mon-Fri 8a-5p [phone] after 5p, weekends, holidays  CC: Timmothy Euler, MD

## 2016-09-18 LAB — HEPATITIS B SURFACE ANTIBODY,QUALITATIVE: Hep B S Ab: NEGATIVE

## 2016-09-18 LAB — HEPATITIS C ANTIBODY: HCV AB: NEGATIVE

## 2016-09-18 LAB — HEPATITIS B SURFACE ANTIGEN: Hepatitis B Surface Ag: NEGATIVE

## 2016-09-18 LAB — ANA: Anti Nuclear Antibody(ANA): NEGATIVE

## 2016-09-18 LAB — HEPATITIS A ANTIBODY, TOTAL: HEP A TOTAL AB: NONREACTIVE

## 2016-09-19 LAB — MITOCHONDRIAL ANTIBODIES: Mitochondrial M2 Ab, IgG: <=20 Units (ref <=20.0)

## 2016-09-19 LAB — CERULOPLASMIN: CERULOPLASMIN: 27 mg/dL (ref 18–36)

## 2016-09-19 LAB — ALPHA-1-ANTITRYPSIN: A-1 Antitrypsin, Ser: 138 mg/dL (ref 83–199)

## 2016-09-19 LAB — ANTI-SMOOTH MUSCLE ANTIBODY, IGG: Smooth Muscle Ab: <20 U (ref <20)

## 2016-12-01 DIAGNOSIS — K080 Exfoliation of teeth due to systemic causes: Secondary | ICD-10-CM | POA: Diagnosis not present

## 2016-12-22 ENCOUNTER — Ambulatory Visit (HOSPITAL_COMMUNITY)
Admission: RE | Admit: 2016-12-22 | Discharge: 2016-12-22 | Disposition: A | Discharge status: Home / Self Care | Payer: BLUE CROSS/BLUE SHIELD | Attending: Psychiatry | Admitting: Psychiatry

## 2016-12-22 ENCOUNTER — Ambulatory Visit (INDEPENDENT_AMBULATORY_CARE_PROVIDER_SITE_OTHER): Payer: BLUE CROSS/BLUE SHIELD | Admitting: Nurse Practitioner

## 2016-12-22 ENCOUNTER — Encounter: Payer: Self-pay | Admitting: Nurse Practitioner

## 2016-12-22 VITALS — BP 133/91 | HR 109 | Temp 97.5°F | Ht 69.0 in | Wt 252.0 lb

## 2016-12-22 DIAGNOSIS — F419 Anxiety disorder, unspecified: Secondary | ICD-10-CM | POA: Insufficient documentation

## 2016-12-22 DIAGNOSIS — F329 Major depressive disorder, single episode, unspecified: Secondary | ICD-10-CM | POA: Diagnosis not present

## 2016-12-22 DIAGNOSIS — F332 Major depressive disorder, recurrent severe without psychotic features: Secondary | ICD-10-CM | POA: Diagnosis not present

## 2016-12-22 NOTE — H&P (Signed)
Behavioral Health Medical Screening Exam  Michael Roman is an 26 y.o. male who arrived voluntarily to [facility] accompanied by his parents with c/o anxiety. Patient denies any SI/HI/VAH and is requesting OP resources for medication and therapy. Patient currently takes Zoloft 150 mg being prescribed by his PCP.  Total Time spent with patient: 30 minutes  Psychiatric Specialty Exam: Physical Exam  Vitals reviewed. Constitutional: He is oriented to person, place, and time. He appears well-developed and well-nourished.  HENT:  Head: Normocephalic and atraumatic.  Eyes: Pupils are equal, round, and reactive to light. Conjunctivae are normal.  Neck: Normal range of motion. Neck supple.  Cardiovascular: Normal rate, regular rhythm and normal heart sounds.   Respiratory: Effort normal and breath sounds normal.  GI: Soft. Bowel sounds are normal.  Genitourinary:  Genitourinary Comments: Deferred  Musculoskeletal: Normal range of motion.  Neurological: He is alert and oriented to person, place, and time.  Skin: Skin is warm and dry.    Review of Systems  Psychiatric/Behavioral: Positive for depression. Negative for hallucinations, memory loss, substance abuse and suicidal ideas. The patient is nervous/anxious and has insomnia.   All other systems reviewed and are negative.   Blood pressure 131/83, pulse (!) 103, temperature 98.6 F (37 C), temperature source Oral, resp. rate 18, SpO2 99 %.There is no height or weight on file to calculate BMI.  General Appearance: Casual  Eye Contact:  Good  Speech:  Clear and Coherent and Normal Rate  Volume:  Normal  Mood:  Anxious  Affect:  Congruent  Thought Process:  Coherent and Goal Directed  Orientation:  Full (Time, Place, and Person)  Thought Content:  WDL and Logical  Suicidal Thoughts:  No  Homicidal Thoughts:  No  Memory:  Immediate;   Good Recent;   Good Remote;   Fair  Judgement:  Good  Insight:  Good  Psychomotor Activity:  Normal   Concentration: Concentration: Good and Attention Span: Good  Recall:  Good  Fund of Knowledge:Good  Language: Good  Akathisia:  Negative  Handed:  Right  AIMS (if indicated):     Assets:  Communication Skills Desire for Improvement Financial Resources/Insurance Housing Leisure Time Physical Health Resilience Social Support  Sleep:       Musculoskeletal: Strength & Muscle Tone: within normal limits Gait & Station: normal Patient leans: N/A  Blood pressure 131/83, pulse (!) 103, temperature 98.6 F (37 C), temperature source Oral, resp. rate 18, SpO2 99 %.  Recommendations:  Based on my evaluation the patient does not appear to have an emergency medical condition.  Patient was provided with OP resources as requested.   Vicenta Aly, NP 12/22/2016, 5:20 PM

## 2016-12-22 NOTE — Progress Notes (Addendum)
Subjective:    Patient ID: Michael Roman, male    DOB: 1990/10/02, 26 y.o.   MRN: [id number]  HPIPatient comes in today accompanied by his mom. He is crying during exam. Says he just feels so depressed and anxious that he cannot go on t]like this anymore. He has had anxeity since he was 70 years lad. He is currently on zoloft 164m daily.  He says he cannot go another week feeling the way he does. He is not suicidal but says that does not feel worth living.  [facility] 12/22/2016 08/12/2016 07/03/2016 10/04/2015 09/03/2015  Decreased Interest 3 0 0 0 0  Down, Depressed, Hopeless 3 0 0 0 0  PHQ - 2 Score 6 0 0 0 0  Altered sleeping 3 - - - -  Tired, decreased energy 3 - - - -  Change in appetite 2 - - - -  Feeling bad or failure about yourself  3 - - - -  Trouble concentrating 3 - - - -  Moving slowly or fidgety/restless 1 - - - -  Suicidal thoughts 2 - - - -  PHQ-9 Score 23 - - - -       Review of Systems  Constitutional: Negative.   Respiratory: Negative.   Cardiovascular: Negative.   Genitourinary: Negative.   Neurological: Negative.   Psychiatric/Behavioral: Positive for sleep disturbance. The patient is nervous/anxious.   All other systems reviewed and are negative.      Objective:   Physical Exam  Constitutional: He appears well-developed and well-nourished.  Cardiovascular: Normal rate and regular rhythm.   Pulmonary/Chest: Effort normal and breath sounds normal.  Neurological: He is alert.  Skin: Skin is warm.  Psychiatric:  Patient was vey tearful Answered questions appropriately- just feels lost, does not know what to do- says cannot continue life this way   BP (!) 133/91   Pulse (!) 109   Temp (!) 97.5 F (36.4 C) (Oral)   Ht 5' 9"  (1.753 m)   Wt 252 lb (114.3 kg)   BMI 37.21 kg/m           Assessment & Plan:   1. Severe episode of recurrent major depressive disorder, without psychotic features ([place]    Due to severity of depression I  felt it was best that mom take him to behavioral health immediately. He agreed to go Explained that chjanging his meds will not work fast enough to get him out of this depression RTO as needed  MJohnson FNP   *[place] and gave him a list of counselors to call. I am goijng to give him a few doses of klonopin to only take when he cannot function and I am also going to increase his zoloft to 209ma day until he can see a coSocial workerI may change him to prozac at some point and stop zoloft. Patient will contact me once he sees counselor. Meds ordered this encounter  Medications  . hydrOXYzine (ATARAX/VISTARIL) 25 MG tablet    Sig: Take 25 mg by mouth 3 (three) times daily as needed.  . sertraline (ZOLOFT) 100 MG tablet    Sig: 2 po daily    Dispense:  60 tablet    Refill:  3    Order Specific Question:   Supervising Provider    Answer:   VINCENT, CAROL L [[id number]]  . clonazePAM (KLONOPIN) 0.5 MG tablet    Sig:  Take 1 tablet (0.5 mg total) by mouth 2 (two) times daily as needed for anxiety.    Dispense:  20 tablet    Refill:  0    Order Specific Question:   Supervising Provider    Answer:   Eustaquio Maize [[id number]]   Mary-Margaret Hassell Done, FNP

## 2016-12-22 NOTE — BH Assessment (Signed)
Tele Assessment Note     Michael Roman is an 26 y.o. male presenting to [facility] at the request of his medical provider. The patient presented to his physician today in the hopes of having a medication change. He is currently on 150 mg of Zoloft but reports increased depression over the past few months and most recently panic attacks in the last few days. Admits to fleeting SI in the past and recently, none today. Denies hx of plan or intent. The patient started having symptoms of anxiety and depression in the 5th grade. He has managed well in the past with medication management and therapy. States an active social life in high school and college and involved in activities. Over the last year he returned home and lives with his parents. Describes a diminished social network, adjusting to a career and adult life and self imposed expectations for the future. As a result, the patient states he stays in bed when he is not working, has depressed mood, low view of current circumstances and now increased anxiety. Denies HI or A/V. Denies drug use.   The patient had unremarkable appearance, good eye contact, logical speech, alert, depressed mood, anxious affect, coherent thought, unimpaired judgement, oriented, fair insight and good impulse control.   Zerita Boers, NP recommends outpatient resources. Referrals given for psychiatry and therapy.    Diagnosis: MDD, recurrent moderate, without psychosis; GAD  Past Medical History:  Past Medical History:  Diagnosis Date  . Depression     Past Surgical History:  Procedure Laterality Date  . SHOULDER ARTHROSCOPY W/ LABRAL REPAIR     x2  . WISDOM TOOTH EXTRACTION      Family History:  Family History  Problem Relation Age of Onset  . Hypertension Mother   . Hypertension Father   . Colon cancer Neg Hx   . Stomach cancer Neg Hx     Social History:  reports that he has never smoked. He has never used smokeless tobacco. He reports that he does not drink  alcohol or use drugs.  Additional Social History:  Alcohol / Drug Use Pain Medications: see MAR Prescriptions: see MAR Over the Counter: see MAR History of alcohol / drug use?: No history of alcohol / drug abuse  CIWA: CIWA-Ar BP: 131/83 Pulse Rate: (!) 103 COWS:    PATIENT STRENGTHS: (choose at least two) Average or above average intelligence General fund of knowledge  Allergies:  Allergies  Allergen Reactions  . Vantin [Cefpodoxime]     Home Medications:  (Not in a hospital admission)  OB/GYN Status:  No LMP for male patient.  General Assessment Data Location of Assessment: [facility] Assessment Services TTS Assessment: In system Is this a Tele or Face-to-Face Assessment?: Face-to-Face Is this an Initial Assessment or a Re-assessment for this encounter?: Initial Assessment Marital status: Single Is patient pregnant?: No Pregnancy Status: No Living Arrangements: Parent Can pt return to current living arrangement?: Yes Admission Status: Voluntary Is patient capable of signing voluntary admission?: Yes Referral Source: Self/Family/Friend Insurance type: [place] Screening Exam ([place]) Medical Exam completed: Yes  Crisis Care Plan Living Arrangements: Parent Name of Psychiatrist: n/a Name of Therapist: n/a  Education Status Is patient currently in school?: No Highest grade of school patient has completed: college graduate  Risk to self with the past 6 months Suicidal Ideation: No Has patient been a risk to self within the past 6 months prior to admission? : Yes Suicidal Intent: No Has patient had any suicidal intent  within the past 6 months prior to admission? : No Is patient at risk for suicide?: No Suicidal Plan?: No Has patient had any suicidal plan within the past 6 months prior to admission? : No Access to Means: No What has been your use of drugs/alcohol within the last 12 months?: n/a Previous Attempts/Gestures: No How many times?:  0 Intentional Self Injurious Behavior: None Family Suicide History: Unknown Persecutory voices/beliefs?: No Depression: Yes Depression Symptoms: Tearfulness, Feeling worthless/self pity Substance abuse history and/or treatment for substance abuse?: No Suicide prevention information given to non-admitted patients: Yes  Risk to Others within the past 6 months Homicidal Ideation: No Does patient have any lifetime risk of violence toward others beyond the six months prior to admission? : No Thoughts of Harm to Others: No Current Homicidal Intent: No Current Homicidal Plan: No Access to Homicidal Means: No History of harm to others?: No Assessment of Violence: None Noted Does patient have access to weapons?: No Criminal Charges Pending?: No Does patient have a court date: No Is patient on probation?: No  Psychosis Hallucinations: None noted Delusions: None noted  Mental Status Report Appearance/Hygiene: Unremarkable Eye Contact: Good Motor Activity: Freedom of movement Speech: Logical/coherent Level of Consciousness: Alert Mood: Depressed Affect: Anxious Anxiety Level: Panic Attacks Panic attack frequency: daily the last few days Most recent panic attack: yesterday Thought Processes: Coherent, Relevant Judgement: Unimpaired Orientation: Person, Place, Time, Situation Obsessive Compulsive Thoughts/Behaviors: None  Cognitive Functioning Concentration: Decreased Memory: Recent Intact, Remote Intact IQ: Average Insight: Fair Impulse Control: Good Appetite: Fair Weight Loss: 0 Weight Gain: 0 Sleep: No Change Vegetative Symptoms: Staying in bed  ADLScreening [facility] Assessment Services) Patient's cognitive ability adequate to safely complete daily activities?: Yes Patient able to express need for assistance with ADLs?: Yes Independently performs ADLs?: Yes (appropriate for developmental age)  Prior Inpatient Therapy Prior Inpatient Therapy: No  Prior Outpatient  Therapy Prior Outpatient Therapy: Yes Prior Therapy Dates: years ago Prior Therapy Facilty/Provider(s): local therapist Reason for Treatment: depression, anxiety Does patient have an ACCT team?: No Does patient have Intensive In-House Services?  : No Does patient have [facility] services? : No Does patient have P4CC services?: No  ADL Screening (condition at time of admission) Patient's cognitive ability adequate to safely complete daily activities?: Yes Is the patient deaf or have difficulty hearing?: No Does the patient have difficulty seeing, even when wearing glasses/contacts?: No Does the patient have difficulty concentrating, remembering, or making decisions?: No Patient able to express need for assistance with ADLs?: Yes Does the patient have difficulty dressing or bathing?: No Independently performs ADLs?: Yes (appropriate for developmental age)       Abuse/Neglect Assessment (Assessment to be complete while patient is alone) Physical Abuse: Denies Verbal Abuse: Denies Sexual Abuse: Denies     Regulatory affairs officer (For Healthcare) Does Patient Have a Medical Advance Directive?: No    Additional Information 1:1 In Past 12 Months?: No CIRT Risk: No Elopement Risk: No Does patient have medical clearance?: No     Disposition:  Disposition Initial Assessment Completed for this Encounter: Yes Disposition of Patient: Outpatient treatment Type of outpatient treatment: Adult     Mollie Germany 12/22/2016 8:46 PM

## 2016-12-23 ENCOUNTER — Encounter: Payer: Self-pay | Admitting: Nurse Practitioner

## 2016-12-23 MED ORDER — SERTRALINE HCL 100 MG PO TABS: ORAL_TABLET | ORAL | 3 refills | Status: DC

## 2016-12-23 MED ORDER — CLONAZEPAM 0.5 MG PO TABS: 0.5000 mg | ORAL_TABLET | Freq: Two times a day (BID) | ORAL | 0 refills | Status: DC | PRN

## 2016-12-23 NOTE — Addendum Note (Signed)
Addended by: Chevis Pretty on: 12/23/2016 08:12 AM   Modules accepted: Orders

## 2016-12-25 ENCOUNTER — Encounter: Payer: Self-pay | Admitting: Nurse Practitioner

## 2017-01-02 ENCOUNTER — Encounter: Payer: Self-pay | Admitting: Family Medicine

## 2017-01-02 ENCOUNTER — Ambulatory Visit (INDEPENDENT_AMBULATORY_CARE_PROVIDER_SITE_OTHER): Payer: BLUE CROSS/BLUE SHIELD | Admitting: Family Medicine

## 2017-01-02 VITALS — BP 131/83 | HR 105 | Temp 98.9°F | Ht 69.0 in | Wt 252.0 lb

## 2017-01-02 DIAGNOSIS — I10 Essential (primary) hypertension: Secondary | ICD-10-CM | POA: Diagnosis not present

## 2017-01-02 DIAGNOSIS — F411 Generalized anxiety disorder: Secondary | ICD-10-CM

## 2017-01-02 DIAGNOSIS — E669 Obesity, unspecified: Secondary | ICD-10-CM

## 2017-01-02 NOTE — Progress Notes (Signed)
   HPI  Patient presents today here for six-month follow-up her  Patient states that over the last 6 months he has been watching his diet much closer and is exercising regularly. He's had difficulty with depression and anxiety recently, he is currently seeing a counselor in seeking help with a psychiatrist.  He is tolerating Zoloft well. He denies any suicidal thoughts today.  He states that since losing little bit of weight his blood pressure has been much more controlled, generally in the 130s over 80s. No chest pain, dyspnea, palpitations, leg edema, headaches.  PMH: Smoking status noted ROS: Per HPI  Objective: BP 131/83   Pulse (!) 105   Temp 98.9 F (37.2 C) (Oral)   Ht 5' 9"  (1.753 m)   Wt 252 lb (114.3 kg)   BMI 37.21 kg/m  Gen: NAD, alert, cooperative with exam HEENT: NCAT CV: RRR, good S1/S2, no murmur Resp: CTABL, no wheezes, non-labored Abd: SNTND, BS present, no guarding or organomegaly Ext: No edema, warm Neuro: Alert and oriented, No gross deficits  Assessment and plan:  # Depression, generalized anxiety disorder Patient feeling much better after starting therapy and medication Denies suicidal thoughts today Continue current medications, continue pursuing psychiatric care  # Obesity Improving Congratulated patient on his lifestyle improvements  # Elevated blood pressure without diagnosis of hypertension Improving Likely due to decreasing weight and improvement in lifestyle changes. Congratulated patient    Laroy Apple, MD [facility] 01/02/2017, 3:39 PM

## 2017-01-09 DIAGNOSIS — F411 Generalized anxiety disorder: Secondary | ICD-10-CM | POA: Diagnosis not present

## 2017-01-09 DIAGNOSIS — F331 Major depressive disorder, recurrent, moderate: Secondary | ICD-10-CM | POA: Diagnosis not present

## 2017-01-23 DIAGNOSIS — F331 Major depressive disorder, recurrent, moderate: Secondary | ICD-10-CM | POA: Diagnosis not present

## 2017-01-23 DIAGNOSIS — F411 Generalized anxiety disorder: Secondary | ICD-10-CM | POA: Diagnosis not present

## 2017-02-27 ENCOUNTER — Telehealth: Payer: Self-pay | Admitting: *Deleted

## 2017-02-27 DIAGNOSIS — R7989 Other specified abnormal findings of blood chemistry: Secondary | ICD-10-CM

## 2017-02-27 DIAGNOSIS — R945 Abnormal results of liver function studies: Secondary | ICD-10-CM

## 2017-02-27 NOTE — Telephone Encounter (Signed)
==  View-only below this line===  ----- Message ----- From: Oda Kilts, CMA Sent: 02/27/2017 To: Oda Kilts, CMA Subject: Ultrasound and Hepatic function panel 6 mont*  Pt needs 6 months f/u labs Hepatic function panel and abdominal US in Novemeber and also a follow up appointment with Dr Silverio Decamp  Pt wants labs and [place] and Office appt on same day if at all possible    You have been scheduled for an abdominal ultrasound at [facility] Radiology (1st floor of hospital) on 03/06/2017 at 12:30pm. Please arrive 15 minutes prior to your appointment for registration. Make certain not to have anything to eat or drink 8  hours prior to your appointment. Should you need to reschedule your appointment, please contact radiology at [phone]. This test typically takes about 30 minutes to perform.  Scheduled patient for labs and [place] for 03/06/2017 on the same day , patient will need a follow up pending the results of [place] and Labs   Patient aware of date and times

## 2017-03-06 ENCOUNTER — Other Ambulatory Visit: Payer: BLUE CROSS/BLUE SHIELD

## 2017-03-06 ENCOUNTER — Ambulatory Visit (HOSPITAL_COMMUNITY)
Admission: RE | Admit: 2017-03-06 | Discharge: 2017-03-06 | Disposition: A | Discharge status: Home / Self Care | Payer: BLUE CROSS/BLUE SHIELD | Source: Ambulatory Visit | Attending: Gastroenterology | Admitting: Gastroenterology

## 2017-03-06 DIAGNOSIS — K769 Liver disease, unspecified: Secondary | ICD-10-CM | POA: Diagnosis not present

## 2017-03-06 DIAGNOSIS — R945 Abnormal results of liver function studies: Secondary | ICD-10-CM

## 2017-03-06 DIAGNOSIS — K824 Cholesterolosis of gallbladder: Secondary | ICD-10-CM | POA: Insufficient documentation

## 2017-03-06 DIAGNOSIS — R7989 Other specified abnormal findings of blood chemistry: Secondary | ICD-10-CM

## 2017-03-06 LAB — HEPATIC FUNCTION PANEL
ALBUMIN: 5 g/dL (ref 3.5–5.2)
ALK PHOS: 43 U/L (ref 39–117)
ALT: 62 U/L — ABNORMAL HIGH (ref 0–53)
AST: 38 U/L — ABNORMAL HIGH (ref 0–37)
Bilirubin, Direct: 0.1 mg/dL (ref 0.0–0.3)
Total Bilirubin: 0.6 mg/dL (ref 0.2–1.2)
Total Protein: 8.6 g/dL — ABNORMAL HIGH (ref 6.0–8.3)

## 2017-03-12 ENCOUNTER — Ambulatory Visit: Payer: BLUE CROSS/BLUE SHIELD | Admitting: Gastroenterology

## 2017-03-12 ENCOUNTER — Encounter: Payer: Self-pay | Admitting: Gastroenterology

## 2017-03-12 VITALS — BP 132/90 | HR 80 | Ht 69.0 in | Wt 258.1 lb

## 2017-03-12 DIAGNOSIS — R945 Abnormal results of liver function studies: Secondary | ICD-10-CM | POA: Diagnosis not present

## 2017-03-12 DIAGNOSIS — R7989 Other specified abnormal findings of blood chemistry: Secondary | ICD-10-CM

## 2017-03-12 DIAGNOSIS — R140 Abdominal distension (gaseous): Secondary | ICD-10-CM

## 2017-03-12 DIAGNOSIS — K824 Cholesterolosis of gallbladder: Secondary | ICD-10-CM

## 2017-03-12 DIAGNOSIS — K5900 Constipation, unspecified: Secondary | ICD-10-CM

## 2017-03-12 DIAGNOSIS — K760 Fatty (change of) liver, not elsewhere classified: Secondary | ICD-10-CM | POA: Diagnosis not present

## 2017-03-12 NOTE — Progress Notes (Signed)
Michael Roman    [id number]    06/14/1990  Primary Care Physician:Bradshaw, Sherley Bounds, MD  Referring Physician: Timmothy Euler, MD [place], [place] [address]  Chief complaint:  Bloating HPI: 26 year old male with hypercholesterolemia, fatty liver with elevated AST and ALT,  peak AST 42 and ALT 71. He was noted to have 7 mm gallbladder polyp with gallbladder wall focal thickening concerning for cholesterolosis or adenoma myomatosis.  Repeat ultrasound this month showed slightly increased in size of gallbladder polyp to 9 mm. He is trying to lose weight and exercise currently under tremendous stress and anxiety, feels he is eating more due to stress.  He is waking up middle of night from sleep and snacking almost every day.  Plan to get a sleep study.  Complaints of intermittent constipation and worsening bloating .  Denies any blood per rectum, nausea, vomiting or abdominal pain. Hepatitis B and C negative.  Negative for autoimmune hepatitis, PSC or PBC.  Outpatient Encounter Medications as of 03/12/2017  Medication Sig  . clonazePAM (KLONOPIN) 0.5 MG tablet Take 1 tablet (0.5 mg total) by mouth 2 (two) times daily as needed for anxiety.  . hydrOXYzine (ATARAX/VISTARIL) 25 MG tablet Take 25 mg by mouth 3 (three) times daily as needed.  . sertraline (ZOLOFT) 100 MG tablet 2 po daily   No facility-administered encounter medications on file as of 03/12/2017.     Allergies as of 03/12/2017 - Review Complete 03/12/2017  Allergen Reaction Noted  . Vantin [cefpodoxime]  12/06/2012    Past Medical History:  Diagnosis Date  . Depression     Past Surgical History:  Procedure Laterality Date  . SHOULDER ARTHROSCOPY W/ LABRAL REPAIR     x2  . WISDOM TOOTH EXTRACTION      Family History  Problem Relation Age of Onset  . Hypertension Mother   . Hypertension Father   . Colon cancer Neg Hx   . Stomach cancer Neg Hx     Social History   Socioeconomic  History  . Marital status: Single    Spouse name: Not on file  . Number of children: Not on file  . Years of education: Not on file  . Highest education level: Not on file  Social Needs  . Financial resource strain: Not on file  . Food insecurity - worry: Not on file  . Food insecurity - inability: Not on file  . Transportation needs - medical: Not on file  . Transportation needs - non-medical: Not on file  Occupational History  . Occupation: Chief Executive Officer   Tobacco Use  . Smoking status: Never Smoker  . Smokeless tobacco: Never Used  Substance and Sexual Activity  . Alcohol use: No  . Drug use: No  . Sexual activity: Not Currently  Other Topics Concern  . Not on file  Social History Narrative  . Not on file      Review of systems: Review of Systems  Constitutional: Negative for fever and chills.  HENT: Negative.   Eyes: Negative for blurred vision.  Respiratory: Negative for cough, shortness of breath and wheezing.   Cardiovascular: Negative for chest pain and palpitations.  Gastrointestinal: as per HPI Genitourinary: Negative for dysuria, urgency, frequency and hematuria.  Musculoskeletal: Negative for myalgias, back pain and joint pain.  Skin: Negative for itching and rash.  Neurological: Negative for dizziness, tremors, focal weakness, seizures and loss of consciousness.  Endo/Heme/Allergies: Positive for seasonal  allergies.  Psychiatric/Behavioral: Negative for depression, suicidal ideas and hallucinations.  All other systems reviewed and are negative.   Physical Exam: Vitals:   03/12/17 1011  BP: 132/90  Pulse: 80   Body mass index is 38.12 kg/m. Gen:      No acute distress HEENT:  EOMI, sclera anicteric Neck:     No masses; no thyromegaly Lungs:    Clear to auscultation bilaterally; normal respiratory effort CV:         Regular rate and rhythm; no murmurs Abd:      + bowel sounds; soft, non-tender; no palpable masses, no distension Ext:    No edema;  adequate peripheral perfusion Skin:      Warm and dry; no rash Neuro: alert and oriented x 3 Psych: normal mood and affect  Data Reviewed:  Reviewed labs, radiology imaging, old records and pertinent past GI work up   Assessment and Plan/Recommendations:  26 year old male with abnormal LFT secondary to fatty liver Advised patient to avoid high carb diet and exercise regularly Refer to dietitian to discuss low-carb low-fat diet  Recheck lipid panel, if LDL and triglycerides persistently elevated, will discuss with PMD and see if we need to consider starting low-dose statin  Gallbladder polyp 9 mm in size, increased compared to previous ultrasound with focal gallbladder wall thickening Patient was referred to [facility] surgery to discuss possible cholecystectomy  Constipation and bloating: Increase dietary fiber and fluid intake Benefiber 1 tablespoon 3 times daily with meals  Recheck LFT in 3 months  Return in 6 months  25 minutes was spent face-to-face with the patient. Greater than 50% of the time used for counseling as well as treatment plan and follow-up. He had multiple questions which were answered to his satisfaction  K. Denzil Magnuson , MD [phone] Mon-Fri 8a-5p [phone] after 5p, weekends, holidays  CC: Timmothy Euler, MD

## 2017-03-12 NOTE — Patient Instructions (Addendum)
Your physician has requested that you go to the basement for lab work this week.    Repeat labs in 3 months.   Follow up with [facility], we will contact them and confirm your appointment.  A referral has been sent to the dietician.  You will get call from that office.  Please call in 1 week if you have not heard from them.  Follow up with Dr Silverio Decamp in 4-6 months.  Normal BMI (Body Mass Index- based on height and weight) is between 19 and 25. Your BMI today is Body mass index is 38.12 kg/m. Marland Kitchen Please consider follow up  regarding your BMI with your Primary Care Provider.

## 2017-03-13 ENCOUNTER — Other Ambulatory Visit (INDEPENDENT_AMBULATORY_CARE_PROVIDER_SITE_OTHER): Payer: BLUE CROSS/BLUE SHIELD

## 2017-03-13 DIAGNOSIS — K760 Fatty (change of) liver, not elsewhere classified: Secondary | ICD-10-CM

## 2017-03-13 DIAGNOSIS — R7989 Other specified abnormal findings of blood chemistry: Secondary | ICD-10-CM

## 2017-03-13 DIAGNOSIS — R945 Abnormal results of liver function studies: Secondary | ICD-10-CM

## 2017-03-13 DIAGNOSIS — K824 Cholesterolosis of gallbladder: Secondary | ICD-10-CM | POA: Diagnosis not present

## 2017-03-13 LAB — LIPID PANEL
CHOL/HDL RATIO: 6
Cholesterol: 210 mg/dL — ABNORMAL HIGH (ref 0–200)
HDL: 33.6 mg/dL — ABNORMAL LOW (ref >39.00)
LDL Cholesterol: 144 mg/dL — ABNORMAL HIGH (ref 0–99)
NONHDL: 176.74
Triglycerides: 165 mg/dL — ABNORMAL HIGH (ref 0.0–149.0)
VLDL: 33 mg/dL (ref 0.0–40.0)

## 2017-03-13 LAB — HEPATIC FUNCTION PANEL
ALK PHOS: 46 U/L (ref 39–117)
ALT: 54 U/L — AB (ref 0–53)
AST: 34 U/L (ref 0–37)
Albumin: 4.8 g/dL (ref 3.5–5.2)
BILIRUBIN DIRECT: 0 mg/dL (ref 0.0–0.3)
TOTAL PROTEIN: 8.1 g/dL (ref 6.0–8.3)
Total Bilirubin: 0.5 mg/dL (ref 0.2–1.2)

## 2017-03-17 ENCOUNTER — Encounter: Payer: Self-pay | Admitting: Nurse Practitioner

## 2017-03-17 ENCOUNTER — Other Ambulatory Visit: Payer: Self-pay | Admitting: Nurse Practitioner

## 2017-03-17 DIAGNOSIS — R5383 Other fatigue: Secondary | ICD-10-CM

## 2017-03-31 ENCOUNTER — Telehealth: Payer: Self-pay

## 2017-03-31 NOTE — Telephone Encounter (Signed)
Please address note

## 2017-03-31 NOTE — Telephone Encounter (Signed)
Following up on sleep study  Laroy Apple, MD [facility] 03/31/2017, 11:05 AM

## 2017-03-31 NOTE — Telephone Encounter (Signed)
You put in an order for a home sleep study and [employer] requiring peer to peer review within 24 hours   [phone] per [facility]

## 2017-04-01 ENCOUNTER — Other Ambulatory Visit: Payer: Self-pay

## 2017-04-01 DIAGNOSIS — G4730 Sleep apnea, unspecified: Secondary | ICD-10-CM

## 2017-04-01 NOTE — Telephone Encounter (Signed)
If they wont pay for home sleep study will they pay for reg sleep study

## 2017-04-01 NOTE — Telephone Encounter (Signed)
Are you going to go ahead and send over?

## 2017-04-01 NOTE — Telephone Encounter (Signed)
I will.

## 2017-04-01 NOTE — Telephone Encounter (Signed)
Dont know we'd just have to send it over because they pre cert them wherever they are going to have it done

## 2017-04-03 DIAGNOSIS — K824 Cholesterolosis of gallbladder: Secondary | ICD-10-CM | POA: Diagnosis not present

## 2017-04-17 ENCOUNTER — Encounter: Payer: BLUE CROSS/BLUE SHIELD | Attending: Gastroenterology | Admitting: Dietician

## 2017-04-17 ENCOUNTER — Encounter: Payer: Self-pay | Admitting: Dietician

## 2017-04-17 DIAGNOSIS — R945 Abnormal results of liver function studies: Secondary | ICD-10-CM | POA: Insufficient documentation

## 2017-04-17 DIAGNOSIS — K824 Cholesterolosis of gallbladder: Secondary | ICD-10-CM | POA: Diagnosis not present

## 2017-04-17 DIAGNOSIS — E669 Obesity, unspecified: Secondary | ICD-10-CM

## 2017-04-17 DIAGNOSIS — Z713 Dietary counseling and surveillance: Secondary | ICD-10-CM | POA: Insufficient documentation

## 2017-04-17 DIAGNOSIS — K760 Fatty (change of) liver, not elsewhere classified: Secondary | ICD-10-CM | POA: Insufficient documentation

## 2017-04-17 DIAGNOSIS — R7989 Other specified abnormal findings of blood chemistry: Secondary | ICD-10-CM

## 2017-04-17 NOTE — Progress Notes (Signed)
  Medical Nutrition Therapy:  Appt start time: 9758 end time:  8325.   Assessment:  Primary concerns today: Patient is here today alone.  He was referred for fatty liver, abnormal LFT's and gallbladder polyp.  He is going to get his gallbladder removed January 14 and see an MD related to sleep apnea January 15.  He reports sleep eating at night on sweets.  Weight Hx: 264 lbs today's weight 265 lbs highest weight 225 lbs lowest adult weight  Patient lives with his parents.  His mother does most of the shopping and cooking.  He works for [employer].  The job is sedentary.   Preferred Learning Style:   No preference indicated   Learning Readiness:   Ready   MEDICATIONS: see list   DIETARY INTAKE:  Usual eating pattern includes 2 meals and 2 snacks per day. Michael Roman has been waking up and sleep eating.  He does not always remember that he did this.  24-hr recall:  B ( AM): skips OR instant oatmeal  Snk ( AM): none  L ( PM): packs lunch (microwave meal or sandwich and chip, occasional fruit Snk ( PM): none D ( PM): fast food or [place] style food (vegetables, fried foods) Snk ( PM): "anything bad" sweets (little debbies, pop tarts) and sometimes can't remember it Beverages: black coffee, diet soda, water, alcohol 1-2 times per month (3 servings)  Usual physical activity: walks 3-4 days per week for 20 minutes at lunch   Progress Towards Goal(s):  In progress.   Nutritional Diagnosis:  NB-1.1 Food and nutrition-related knowledge deficit As related to healthy eating.  As evidenced by diet hx and patient report.    Intervention:  Nutrition education related to mindful eating.  Discussed need to decrease fat in the diet and change to healthier fat choices, alternatives to eating out, and benefits of an improved meal schedule to obtain more nutrition earlier in the day and aim to prevent night eating.  Patient suggested shutting his bedroom  door and asked his family to hide the sweets as options to control the night eating.  (He sleep walks.)  Stay active. Aim for some form of activity most days (30-60 minutes). Consider easy meal planning to reduce the fast food intake. Afternoon snack such as fruit and unsalted nuts or cheese stick OR veges and hummus Recommend breakfast  Oatmeal, walnuts, fruit  Greek yogurt, fruit, nuts  Banana, toast with peanut butter  Cheese toast, fruit  Protein shake, fruit    Bake rather than fry Avoid saturated fats and trans fats. Lean meats Small amounts added fat Find ways to add extra vegetable to your meals. Eat slowly and stop when you are satisfied.   Consider 1000-2000 units of vitamin D daily Consider shutting your door when you sleep  Teaching Method Utilized:  Visual Auditory Hands on  Handouts given during visit include:  Types of fat  Cholesterol and triglycerides  My plate  Barriers to learning/adherence to lifestyle change: long hours  Demonstrated degree of understanding via:  Teach Back   Monitoring/Evaluation:  Dietary intake, exercise, label reading, and body weight prn.

## 2017-04-17 NOTE — Patient Instructions (Signed)
Stay active. Aim for some form of activity most days (30-60 minutes). Consider easy meal planning to reduce the fast food intake. Afternoon snack such as fruit and unsalted nuts or cheese stick OR veges and hummus Recommend breakfast  Oatmeal, walnuts, fruit  Greek yogurt, fruit, nuts  Banana, toast with peanut butter  Cheese toast, fruit  Protein shake, fruit    Bake rather than fry Avoid saturated fats and trans fats. Lean meats Small amounts added fat Find ways to add extra vegetable to your meals. Eat slowly and stop when you are satisfied.   Consider 1000-2000 units of vitamin D daily Consider shutting your door when you sleep.

## 2017-05-11 DIAGNOSIS — K824 Cholesterolosis of gallbladder: Secondary | ICD-10-CM | POA: Diagnosis not present

## 2017-05-11 DIAGNOSIS — K811 Chronic cholecystitis: Secondary | ICD-10-CM | POA: Diagnosis not present

## 2017-05-12 ENCOUNTER — Ambulatory Visit: Payer: BLUE CROSS/BLUE SHIELD | Admitting: Neurology

## 2017-05-12 ENCOUNTER — Encounter: Payer: Self-pay | Admitting: Neurology

## 2017-05-12 VITALS — BP 146/98 | HR 99 | Ht 69.0 in | Wt 265.0 lb

## 2017-05-12 DIAGNOSIS — G478 Other sleep disorders: Secondary | ICD-10-CM | POA: Diagnosis not present

## 2017-05-12 DIAGNOSIS — R0683 Snoring: Secondary | ICD-10-CM | POA: Diagnosis not present

## 2017-05-12 DIAGNOSIS — K7581 Nonalcoholic steatohepatitis (NASH): Secondary | ICD-10-CM

## 2017-05-12 DIAGNOSIS — R0902 Hypoxemia: Secondary | ICD-10-CM

## 2017-05-12 DIAGNOSIS — Z6841 Body Mass Index (BMI) 40.0 and over, adult: Secondary | ICD-10-CM

## 2017-05-12 DIAGNOSIS — G4750 Parasomnia, unspecified: Secondary | ICD-10-CM | POA: Diagnosis not present

## 2017-05-12 DIAGNOSIS — F513 Sleepwalking [somnambulism]: Secondary | ICD-10-CM

## 2017-05-12 DIAGNOSIS — Z9049 Acquired absence of other specified parts of digestive tract: Secondary | ICD-10-CM | POA: Diagnosis not present

## 2017-05-12 NOTE — Progress Notes (Signed)
SLEEP MEDICINE CLINIC   Provider:  [name], M D  Primary Care Physician:  Timmothy Euler, MD   Referring Provider: Timmothy Euler, MD ; Chevis Pretty , FNP of [facility]   Chief Complaint  Patient presents with  . New Patient (Initial Visit)    HPI:  Michael Roman is a 27 y.o. male , seen here in a referral from Dr. Wendi Snipes for a sleep evaluation.     Michael Roman presents here today alone, he is a 27 year old Caucasian right-handed gentleman.  In summer 2018 he suffered from major depression and anxiety which has meanwhile much improved but his sleep study have been suggested to him and he feels now that he would be ready to undergo testing. Michael Roman knows that he is a snorer, as witnessed by his family.  He also reported that while he was functioning okay on Mondays and Tuesdays he became excessively sleepy and fatigued towards the end of the week.  He also reports that he is sleepwalking, sleep eating and sleep talking. Sleepwalking has been present since childhood, he is not sure what triggered new attacks or events.  The sleep eating started 2 or 3 years ago. He now has sleep walking episodes nightly, has not felt he got good sleep- "a full nights sleep" in a while.    I would like to add my note with the remark that Michael Roman just yesterday underwent a cholecystectomy on 11 May 2017, and was treated as a high risk patient because he fulfilled to stop bang criteria for obstructive sleep apnea. He had a ONO last night- with frequent hypoxia episodes and alarms. His night was not restful. Apnea was certainly evident as he desaturated on his back.       Chief complaint according to patient :  Sleep habits are as follows: The patient tries to be in bed before 10 PM, he watches TV in bed and he also uses his smart phone, which delays his sleep latency. He will be asleep however before midnight.  Sleeps alone in his bed and bedroom, the bedroom is  cool quiet and dark.  Prefers to sleep on his side, 2 pillows for head support.  Does not recall vivid dreams and usually seems to sleep through the night in spite of snoring/ sleep walking.  Infrequently,  he will wake up gasping for air or by  feeling choked, and this seems related to having resumed a supine sleep position. He reports that sleepwalking takes place between midnight and 1:30 AM and not so much between 4 and 5 AM. He reports no nocturia. Michael Roman rises at 5.30 AM , relies on an alarm.    Sleep medical history and family sleep history: Hypertension and obesity in both parents.  Michael Roman has never known of his parents being sleep walkers, he has 1 sister but infrequently has had sleepwalking episodes.  She is more of a sleep talker.  She is not snoring. Patient is obese, no enuresis, history of night terrors.   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  Non tobacco user, lifelong. Caffeine : black coffee in AM, 2 -3cups, 1-2 sodas with caffeine - cola. ETOH: rarely now- 1-2 times a month, 2 drinks max.     Review of Systems: Out of a complete 14 system review, the patient complains of only the following symptoms, and all other reviewed  systems are negative.  Michael Roman has gained weight, partially attributed to caloric intake and partially to medication.  Currently endorses the Epworth sleepiness score at 6 points with 2 points each for sitting and reading and lying down to rest in the afternoon, and 1.4 watching TV and as a passenger in a car.  Epworth score 6 , Fatigue severity score 27  , depression score PHQ 9 was not repeated.   Social History   Socioeconomic History  . Marital status: Single    Spouse name: Not on file  . Number of children: Not on file  . Years of education: Not on file  . Highest education level: Not on file  Social Needs  . Financial resource strain: Not on file    . Food insecurity - worry: Not on file  . Food insecurity - inability: Not on file  . Transportation needs - medical: Not on file  . Transportation needs - non-medical: Not on file  Occupational History  . Occupation: Chief Executive Officer   Tobacco Use  . Smoking status: Never Smoker  . Smokeless tobacco: Never Used  Substance and Sexual Activity  . Alcohol use: No  . Drug use: No  . Sexual activity: Not Currently  Other Topics Concern  . Not on file  Social History Narrative  . Not on file    Family History  Problem Relation Age of Onset  . Hypertension Mother   . Hypertension Father   . Colon cancer Neg Hx   . Stomach cancer Neg Hx     Past Medical History:  Diagnosis Date  . Depression   . Elevated liver function tests     Past Surgical History:  Procedure Laterality Date  . SHOULDER ARTHROSCOPY W/ LABRAL REPAIR     x2  . WISDOM TOOTH EXTRACTION      Current Outpatient Medications  Medication Sig Dispense Refill  . cetirizine (ZYRTEC) 10 MG tablet Take 10 mg by mouth 3 times/day as needed-between meals & bedtime for allergies.    . clonazePAM (KLONOPIN) 0.5 MG tablet Take 1 tablet (0.5 mg total) by mouth 2 (two) times daily as needed for anxiety. 20 tablet 0  . hydrOXYzine (ATARAX/VISTARIL) 25 MG tablet Take 25 mg by mouth 3 (three) times daily as needed.    . sertraline (ZOLOFT) 100 MG tablet 2 po daily 60 tablet 3   No current facility-administered medications for this visit.     Allergies as of 05/12/2017 - Review Complete 05/12/2017  Allergen Reaction Noted  . Vantin [cefpodoxime]  12/06/2012    Vitals: BP (!) 146/98   Pulse 99   Ht 5' 9"  (1.753 m)   Wt 265 lb (120.2 kg)   BMI 39.13 kg/m  Last Weight:  Wt Readings from Last 1 Encounters:  05/12/17 265 lb (120.2 kg)   KTG:YBWL mass index is 39.13 kg/m.     Last Height:   Ht Readings from Last 1 Encounters:  05/12/17 5' 9"  (1.753 m)    Physical exam:  General: The patient is awake, alert and  appears not in acute distress. The patient is well groomed. Head: Normocephalic, atraumatic. Neck is supple. Mallampati 4,  neck circumference:19 inches. Nasal airflow patent , Retrognathia is not seen, but a crowded lower jaw. .  Cardiovascular:  Regular rate and rhythm , without  murmurs or carotid bruit, and without distended neck veins. Respiratory: Lungs are clear to auscultation. RR 15 /min.  Skin:  Without evidence of edema, or rash Trunk:  BMI is elevated- 39 plus.  The patient is of stocky built , posture is erect.PYKNIC   Neurologic exam : The patient is awake and alert, oriented to place and time.   Memory subjective described as intact.  He has limited ability to focus, feels as if in a daze, trance .Easily distracted.  Attention span & concentration ability appears impaired, day dreaming.  Speech is fluent,  Without dysarthria, dysphonia or aphasia.  Mood and affect are appropriate.  Cranial nerves: Sense of taste and smell intact. Pupils are equal and briskly reactive to light. No previous eye surgery.  Funduscopic exam without evidence of pallor or edema. Extraocular movements  in vertical and horizontal planes intact and without nystagmus. Visual fields by finger perimetry are intact.Hearing to finger rub intact.  Facial sensation intact to fine touch. Facial motor strength is symmetric and tongue and uvula move midline. Shoulder shrug was symmetrical.   Motor exam: Normal tone, muscle bulk and symmetric strength in all extremities. Normal grip strength.  Sensory:  Fine touch, pinprick and vibration were tested and normal.  Coordination: Rapid alternating movements in the fingers/hands was normal. Finger-to-nose maneuver without evidence of ataxia, dysmetria or tremor. Gait and station: Patient walks without assistive device- and is able unassisted to climb up to the exam table. Strength within normal limits. Stance is stable and normal.  Tandem gait is unfragmented. Turns with 3  Steps. Romberg testing is negative. Deep tendon reflexes: in the  upper and lower extremities are symmetric and intact. Babinski maneuver response is downgoing.    Assessment:  After physical and neurologic examination, review of laboratory studies,  Personal review of imaging studies, reports of other /same  Imaging studies, results of polysomnography and / or neurophysiology testing and pre-existing records as far as provided in visit., my assessment is   1) parasomnia- sleep walking, talking and nightly sleep eating episodes, increased over the last 3 years, sleep walking since childhood.   2) snoring - not sure if he has apnea. Seems dependent on supine position.   3) obesity - he gained over 25 pounds in 24 month, during period of major depression. BMI is now morbidly obese at 39, snoring became worse.   4) tachycardia and HTN- stress related ? White coat ? He feels that he is not longer anxious or panicked..    The patient was advised of the nature of the diagnosed disorder , the treatment options and the  risks for general health and wellness arising from not treating the condition.  I spent more than 50 minutes of face to face time with the patient. Greater than 50% of time was spent in counseling and coordination of care. We have discussed the diagnosis and differential and I answered the patient's questions.    Plan:  Treatment plan and additional workup :  1) I will evaluate him for sleep walking- he has a prescription for klonopin, but doesn't use it - I explained that Klonopin can suppress  parasomnia activity and anxiety.    2) Obesity, morbid: Recommend medical weight management clinic with Dr. Dennard Nip- he has seen nutritional counseling.   3) snoring,  possible presence of apnea, needs an attended sleep study with parasomnia montage. I suspect he has apnea, too.   RV with me after test - [employer] may insist on a HST first to rule out apnea and treat before allowing  parasomnia to be addressed.     [name], MD 04/29/5850, 77:82 AM  Certified in  Neurology by ABPN Certified in [place] by [facility] [address] [place], [place] [id number]

## 2017-05-12 NOTE — Patient Instructions (Signed)
Sleep Studies A sleep study (polysomnogram) is a series of tests done while you are sleeping. It can show how well you sleep. This can help your health care provider diagnose a sleep disorder and show how severe your sleep disorder is. A sleep study may lead to treatment that will help you sleep better and prevent other medical problems caused by poor sleep. If you have a sleep disorder, you may also be at risk for:  Sleep-related accidents.  High blood pressure.  Heart disease.  Stroke.  Other medical conditions.  Sleep disorders are common. Your health care provider may suspect a sleep disorder if you:  Have loud snoring most nights.  Have brief periods when you stop breathing at night.  Feel sleepy on most days.  Fall asleep suddenly during the day.  Have trouble falling asleep or staying asleep.  Feel like you need to move your legs when trying to fall asleep.  Have dreams that seem very real shortly after falling asleep.  Feel like you cannot move when you first wake up.  Which tests will I need to have? Most sleep studies last all night and include these tests:  Recordings of your brain activity.  Recordings of your eye movements.  Recording of your heart rate and rhythm.  Blood pressure readings.  Readings of the amount of oxygen in your blood.  Measurements of your chest and belly movement as you breathe during sleep.  If you have signs of the sleep disorder called sleep apnea during your test, you may get a mask to wear for the second half of the night.  The mask provides continuous positive airway pressure (CPAP). This may improve sleep apnea significantly.  You will then have all tests done again with the mask in place to see if your measurements and recordings change.  How are sleep studies done? Most sleep studies are done over one full night of sleep.  You will arrive at the study center in the evening and can go home in the morning.  Bring your  pajamas and toothbrush.  Do not have caffeine on the day of your sleep study.  Your health care provider will let you know if you need to stop taking any of your regular medicines before the test.  To do the tests included in a polysomnogram, you will have:  Round, sticky patches with sensors attached to recording wires (electrodes) placed on your scalp, face, chest, and limbs.  Wires from all the electrodes and sensors run from your bed to a computer. The wires can be taken off and put back on if you need to get out of bed to go to the bathroom.  A sensor placed over your nose to measure airflow.  A finger clip put on one finger to measure your blood oxygen level.  A belt around your belly and a belt around your chest to measure breathing movements.  Where are sleep studies done? Sleep studies are done at sleep centers. A sleep center may be inside a hospital, office, or clinic. The room where you have the study may look like a hospital room or a hotel room. The health care providers doing the study may come in and out of the room during the study. Most of the time, they will be in another room monitoring your test. How is information from sleep studies helpful? A polysomnogram can be used along with your medical history and a physical exam to diagnose conditions, such as:  Sleep apnea.  Restless legs syndrome.  Sleep-related seizure disorders.  Sleep-related movement disorders.  A medical doctor who specializes in sleep will evaluate your sleep study. The specialist will share the results with your primary health care provider. Treatments based on your sleep study may include:  Improving your sleep habits (sleep hygiene).  Wearing a CPAP mask.  Wearing an oral device at night to improve breathing and reduce snoring.  Taking medicine for: ? Restless legs syndrome. ? Sleep-related seizure disorder. ? Sleep-related movement disorder.  This information is not intended to  replace advice given to you by your health care provider. Make sure you discuss any questions you have with your health care provider. Document Released: 10/19/2002 Document Revised: 12/09/2015 Document Reviewed: 06/20/2013 Elsevier Interactive Patient Education  2018 [place]. Sleep Apnea Sleep apnea is a condition that affects breathing. People with sleep apnea have moments during sleep when their breathing pauses briefly or gets shallow. Sleep apnea can cause these symptoms:  Trouble staying asleep.  Sleepiness or tiredness during the day.  Irritability.  Loud snoring.  Morning headaches.  Trouble concentrating.  Forgetting things.  Less interest in sex.  Being sleepy for no reason.  Mood swings.  Personality changes.  Depression.  Waking up a lot during the night to pee (urinate).  Dry mouth.  Sore throat.  Follow these instructions at home:  Make any changes in your routine that your doctor recommends.  Eat a healthy, well-balanced diet.  Take over-the-counter and prescription medicines only as told by your doctor.  Avoid using alcohol, calming medicines (sedatives), and narcotic medicines.  Take steps to lose weight if you are overweight.  If you were given a machine (device) to use while you sleep, use it only as told by your doctor.  Do not use any tobacco products, such as cigarettes, chewing tobacco, and e-cigarettes. If you need help quitting, ask your doctor.  Keep all follow-up visits as told by your doctor. This is important. Contact a doctor if:  The machine that you were given to use during sleep is uncomfortable or does not seem to be working.  Your symptoms do not get better.  Your symptoms get worse. Get help right away if:  Your chest hurts.  You have trouble breathing in enough air (shortness of breath).  You have an uncomfortable feeling in your back, arms, or stomach.  You have trouble talking.  One side of your body  feels weak.  A part of your face is hanging down (drooping). These symptoms may be an emergency. Do not wait to see if the symptoms will go away. Get medical help right away. Call your local emergency services (911 in the [place] Do not drive yourself to the hospital. This information is not intended to replace advice given to you by your health care provider. Make sure you discuss any questions you have with your health care provider. Document Released: 01/22/2008 Document Revised: 12/09/2015 Document Reviewed: 01/22/2015 Elsevier Interactive Patient Education  [employer].

## 2017-05-25 ENCOUNTER — Ambulatory Visit (INDEPENDENT_AMBULATORY_CARE_PROVIDER_SITE_OTHER): Payer: BLUE CROSS/BLUE SHIELD | Admitting: Neurology

## 2017-05-25 DIAGNOSIS — G4750 Parasomnia, unspecified: Secondary | ICD-10-CM

## 2017-05-25 DIAGNOSIS — R0683 Snoring: Secondary | ICD-10-CM

## 2017-05-25 DIAGNOSIS — Z9049 Acquired absence of other specified parts of digestive tract: Secondary | ICD-10-CM

## 2017-05-25 DIAGNOSIS — K7581 Nonalcoholic steatohepatitis (NASH): Secondary | ICD-10-CM

## 2017-05-25 DIAGNOSIS — Z6841 Body Mass Index (BMI) 40.0 and over, adult: Secondary | ICD-10-CM

## 2017-05-25 DIAGNOSIS — G478 Other sleep disorders: Secondary | ICD-10-CM

## 2017-05-25 DIAGNOSIS — G4710 Hypersomnia, unspecified: Secondary | ICD-10-CM

## 2017-05-25 DIAGNOSIS — F513 Sleepwalking [somnambulism]: Secondary | ICD-10-CM

## 2017-05-25 DIAGNOSIS — R0902 Hypoxemia: Secondary | ICD-10-CM

## 2017-05-27 NOTE — Procedures (Signed)
[facility] [address] [place], [place] [address] NAME:   Michael Roman                                                            DOB: [birth year] MEDICAL RECORD No.: [id number]                                      DOS: 05/25/2017 REFERRING PHYSICIAN: Kenn File, M.D. STUDY PERFORMED: HST apnea link  HISTORY: Michael Roman is a 27 year old Caucasian right-handed gentleman.  In summer 2018 he suffered from major depression and anxiety which has meanwhile much improved. A sleep study had been suggested to him and he feels only now ready to undergo testing. He also reports that he is sleepwalking, sleep eating and sleep talking. Michael Roman knows that he is a snorer, as witnessed by his family.  He also reported that while he was functioning okay on Mondays and Tuesdays he became excessively sleepy and fatigued towards the end of the week.   Sleepwalking has been present since childhood, he is not sure what triggered new attacks or events.  The sleep eating started 2 or 3 years ago. He now has sleep walking episodes nightly, has not felt he got "a full night sleep" in a while.  Michael Roman just underwent a cholecystectomy on 11 May 2017, and was treated as a high risk patient because he fulfilled to stop bang criteria for obstructive sleep apnea. He had worn a pulse- ox last night- there were frequent hypoxia episodes and alarms. Apnea was evident as his oxygen level desaturated while sleeping on his back.  Epworth Sleepiness score at 6, BMI is 39.1.    STUDY RESULTS: Total Recording Time: 07 hours, 31 minutes. Total Apnea/Hypopnea Index (AHI):  5.5 /hr. RDI was 8.8/hr. Average Oxygen Saturation:   93 %; Lowest Oxygen Desaturation: 88 %  Total Time Oxygen Saturation below 89% of Sp02:  3 min.  Average Heart Rate: 73 bpm (46 - 116 bpm)  IMPRESSION:  Only very mild sleep apnea noted, insignificant desaturation, and borderline tachy-brady cardia.   RECOMMENDATION: Based on these test results, there would be no need for intervention. This result is different from the postsurgical observation. I prefer to follow up with an attended sleep study, which can also evaluate the parasomnia symptoms. I certify that I have reviewed the raw data recording prior to the issuance of this report in accordance with the standards of the American Academy of Sleep Medicine (AASM).  [name], M.D.    05-27-2017   Medical Director of [facility] at [facility], accredited by the AASM. Diplomat of the ABPN and ABSM

## 2017-05-28 ENCOUNTER — Other Ambulatory Visit: Payer: Self-pay | Admitting: Neurology

## 2017-05-28 ENCOUNTER — Telehealth: Payer: Self-pay | Admitting: Neurology

## 2017-05-28 DIAGNOSIS — G4733 Obstructive sleep apnea (adult) (pediatric): Secondary | ICD-10-CM

## 2017-05-28 NOTE — Telephone Encounter (Signed)
Called the patient and made him aware that the HST did not show apnea. The patient will need to come in if he is willing for a in lab sleep study. Pt is agreeable to this. Order will be placed and the sleep lab will contact him to get him scheduled. Pt verbalized understanding. Pt had no questions at this time but was encouraged to call back if questions arise.

## 2017-05-28 NOTE — Telephone Encounter (Signed)
-----   Message from [name], MD sent at 05/27/2017  5:27 PM EST ----- Michael Roman, please clarify if patient is able to come in for an attended sleep study- this HST has not given evidence of apnea - in contrast to what is witnessed by family and postsurgical team.  I will order a SPLIT only if patient is agreeable. CD

## 2017-06-10 ENCOUNTER — Other Ambulatory Visit: Payer: Self-pay | Admitting: Nurse Practitioner

## 2017-06-10 DIAGNOSIS — F332 Major depressive disorder, recurrent severe without psychotic features: Secondary | ICD-10-CM

## 2017-06-16 ENCOUNTER — Telehealth: Payer: Self-pay | Admitting: *Deleted

## 2017-06-16 DIAGNOSIS — R945 Abnormal results of liver function studies: Secondary | ICD-10-CM

## 2017-06-16 DIAGNOSIS — R7989 Other specified abnormal findings of blood chemistry: Secondary | ICD-10-CM

## 2017-06-16 NOTE — Telephone Encounter (Signed)
Called pt to inform labs are due  Orders are in he said he would come get them done this week

## 2017-06-19 ENCOUNTER — Other Ambulatory Visit: Payer: BLUE CROSS/BLUE SHIELD

## 2017-06-19 DIAGNOSIS — R7989 Other specified abnormal findings of blood chemistry: Secondary | ICD-10-CM

## 2017-06-19 DIAGNOSIS — R945 Abnormal results of liver function studies: Secondary | ICD-10-CM

## 2017-06-19 LAB — HEPATIC FUNCTION PANEL
ALBUMIN: 4.9 g/dL (ref 3.5–5.2)
ALT: 69 U/L — ABNORMAL HIGH (ref 0–53)
AST: 41 U/L — ABNORMAL HIGH (ref 0–37)
Alkaline Phosphatase: 48 U/L (ref 39–117)
BILIRUBIN TOTAL: 0.6 mg/dL (ref 0.2–1.2)
Bilirubin, Direct: 0.1 mg/dL (ref 0.0–0.3)
Total Protein: 8.3 g/dL (ref 6.0–8.3)

## 2017-07-03 ENCOUNTER — Ambulatory Visit (INDEPENDENT_AMBULATORY_CARE_PROVIDER_SITE_OTHER): Payer: BLUE CROSS/BLUE SHIELD | Admitting: Neurology

## 2017-07-03 DIAGNOSIS — G4733 Obstructive sleep apnea (adult) (pediatric): Secondary | ICD-10-CM

## 2017-07-03 DIAGNOSIS — G4750 Parasomnia, unspecified: Secondary | ICD-10-CM

## 2017-07-06 NOTE — Progress Notes (Signed)
PATIENT'S NAME:  Michael, Roman DOB:      01-01-1991      MR#:    [id number]     DATE OF RECORDING: 07/03/2017 REFERRING M.D.:  Kenn File, MD. Study Performed:   Baseline Polysomnogram with EEG parasomnia montage, Audio and Video HISTORY:  Michael Roman knows that he is a snorer, as has been reported and witnessed by his family.  He also reported that while he was functioning okay on Mondays he became excessively sleepy and fatigued towards the end of the week. Sleepwalking has been present since childhood.  The sleep eating started only 2 or 3 years ago. He now has sleep walking episodes nightly, has not felt he got "a full night sleep" in a while.  His HST performed on 05/25/2017 revealed: 1) Total Recording Time was 07 hours, 31 minutes. Total Apnea/Hypopnea Index (AHI):  5.5 /hr. and RDI was 8.8/hr. Lowest Oxygen Desaturation: 88 %  2) Total Time in Oxygen Saturation below 89% was 3 minutes. Average Heart Rate: 73 bpm, no tachy-bradycardia noted. The patient endorsed the Epworth Sleepiness Scale at 6 points.  FSS at 27 points. The patient's weight is 265 pounds and height is 69 (inches), resulting in a BMI of 39.2 kg/m2.The patient's neck circumference measured 19 inches.  CURRENT MEDICATIONS: Zyrtec, Klonopin, Vistaril, Zoloft   PROCEDURE:  This is a multichannel digital polysomnogram utilizing the Somnostar 11.2 system.  Electrodes and sensors were applied and monitored per AASM Specifications.   EEG, EOG, Chin and Limb EMG, were sampled at 200 Hz.  ECG, Snore and Nasal Pressure, Thermal Airflow, Respiratory Effort, CPAP Flow and Pressure, Oximetry was sampled at 50 Hz. Digital video and audio were recorded.      BASELINE STUDY with expanded EEG: Lights Out was at 22:29 and Lights On at 04:57.  Total recording time (TRT) was 388 minutes, with a total sleep time (TST) of 352 minutes.   The patient's sleep latency was 14.5 minutes.  REM latency was 265 minutes.  The sleep efficiency was 90.7 %.      SLEEP ARCHITECTURE: WASO (Wake after sleep onset) was 26.5 minutes.  There were 16.5 minutes in Stage N1, 260.5 minutes Stage N2, 68.5 minutes Stage N3 and 6.5 minutes in Stage REM.  The percentage of Stage N1 was 4.7%, Stage N2 was 74.%, Stage N3 was 19.5% and Stage R (REM sleep) was 1.8%.   RESPIRATORY ANALYSIS:  There were a total of 58 respiratory events:  9 obstructive apneas, 0 central apneas and 1 mixed apnea with a total of 10 apneas and 48 hypopneas without respiratory event related arousals (RERAs).     The total APNEA/HYPOPNEA INDEX (AHI) was 9.9/hour and the total RESPIRATORY DISTURBANCE INDEX was 9.9 /hour.  0 events occurred in REM sleep and 97 events in NREM. The REM AHI was 0 /hour, versus a non-REM AHI of 10.1. The patient spent 74 minutes of total sleep time in the supine position and 278 minutes in non-supine. The supine AHI was 6.4 versus a non-supine AHI of 10.8.  OXYGEN SATURATION & C02:  The Wake baseline 02 saturation was 92%, with the lowest being 85%. Time spent below 89% saturation equaled 14 minutes. The arousals were noted as: 99 were spontaneous, 0 were associated with PLMs, and 29 were associated with respiratory events. Audio and video analysis did not show any abnormal or unusual movements, behaviors, phonations or vocalizations.  No nocturia. Loud Snoring was noted. EKG was in keeping with normal sinus rhythm (NSR).  EEG was performed in an expanded montage - and did not show seizure potential.    PERIODIC LIMB MOVEMENTS:  The patient had a total of 0 Periodic Limb Movements.  Post-study, the patient indicated that sleep was the same as usual.   IMPRESSION:  1. Mild Obstructive Sleep Apnea(OSA) and moderate snoring. 2. There was no parasomnia activity noted / recorded.   RECOMMENDATIONS:  1. Continue treatment by Klonopin which has suppressed sleep walking/ parasomnia.  There was no REM sleep activity. Consider dedicated sleep psychology referral if insomnia  is of clinical concern.   2. A follow up appointment will be scheduled in the Sleep Clinic at [facility]. The referring provider will be notified of the results.      I certify that I have reviewed the entire raw data recording prior to the issuance of this report in accordance with the Standards of Accreditation of the American Academy of Sleep Medicine (AASM)     [name], MD     07-06-2017  Diplomat, American Board of Psychiatry and Neurology  Diplomat, American Board of [place] Director, [facility] [employer]

## 2017-07-06 NOTE — Procedures (Signed)
No note

## 2017-07-07 ENCOUNTER — Telehealth: Payer: Self-pay | Admitting: Neurology

## 2017-07-07 DIAGNOSIS — F332 Major depressive disorder, recurrent severe without psychotic features: Secondary | ICD-10-CM

## 2017-07-07 MED ORDER — CLONAZEPAM 0.5 MG PO TABS: 0.2500 mg | ORAL_TABLET | Freq: Every evening | ORAL | 2 refills | Status: DC | PRN

## 2017-07-07 NOTE — Telephone Encounter (Signed)
Was it not prescribed by PCP before?  Klonopin at 0.25 mg at night time would be enough. That;s less than I his listed prescription strength of 0.5 bid po.

## 2017-07-07 NOTE — Addendum Note (Signed)
Addended by: [name] on: 07/07/2017 01:16 PM   Modules accepted: Orders

## 2017-07-07 NOTE — Telephone Encounter (Signed)
----- Message from [name], MD sent at 07/06/2017  4:01 PM EDT ----- We did not capture parasomnia , patient should stay on klonipin. Full report under NOTE>   PATIENT'S NAME:  Michael Roman, Michael Roman DOB:      [birth year]      MR#:    [id number]     DATE OF RECORDING: 07/03/2017 REFERRING M.D.:  Kenn File, MD. Study Performed:   Baseline Polysomnogram with EEG parasomnia montage, Audio and Video HISTORY:  Mr. Stgermaine knows that he is a snorer, as has been reported and witnessed by his family.  He also reported that while he was functioning okay on Mondays he became excessively sleepy and fatigued towards the end of the week. Sleepwalking has been present since childhood.  The sleep eating started only 2 or 3 years ago. He now has sleep walking episodes nightly, has not felt he got "a full night sleep" in a while.  His HST performed on 05/25/2017 revealed: 1) Total Recording Time was 07 hours, 31 minutes. Total Apnea/Hypopnea Index (AHI):  5.5 /hr. and RDI was 8.8/hr. Lowest Oxygen Desaturation: 88 %  2) Total Time in Oxygen Saturation below 89% was 3 minutes. Average Heart Rate: 73 bpm, no tachy-bradycardia noted. The patient endorsed the Epworth Sleepiness Scale at 6 points.  FSS at 27 points. The patient's weight is 265 pounds and height is 69 (inches), resulting in a BMI of 39.2 kg/m2.The patient's neck circumference measured 19 inches.  CURRENT MEDICATIONS: Zyrtec, Klonopin, Vistaril, Zoloft   PROCEDURE:  This is a multichannel digital polysomnogram utilizing the Somnostar 11.2 system.  Electrodes and sensors were applied and monitored per AASM Specifications.   EEG, EOG, Chin and Limb EMG, were sampled at 200 Hz.  ECG, Snore and Nasal Pressure, Thermal Airflow, Respiratory Effort, CPAP Flow and Pressure, Oximetry was sampled at 50 Hz. Digital video and audio were recorded.      BASELINE STUDY with expanded EEG: Lights Out was at 22:29 and Lights On at 04:57.  Total recording time (TRT) was  388 minutes, with a total sleep time (TST) of 352 minutes.   The patient's sleep latency was 14.5 minutes.  REM latency was 265 minutes.  The sleep efficiency was 90.7 %.     SLEEP ARCHITECTURE: WASO (Wake after sleep onset) was 26.5 minutes.  There were 16.5 minutes in Stage N1, 260.5 minutes Stage N2, 68.5 minutes Stage N3 and 6.5 minutes in Stage REM.  The percentage of Stage N1 was 4.7%, Stage N2 was 74.%, Stage N3 was 19.5% and Stage R (REM sleep) was 1.8%.   RESPIRATORY ANALYSIS:  There were a total of 58 respiratory events:  9 obstructive apneas, 0 central apneas and 1 mixed apnea with a total of 10 apneas and 48 hypopneas without respiratory event related arousals (RERAs).     The total APNEA/HYPOPNEA INDEX (AHI) was 9.9/hour and the total RESPIRATORY DISTURBANCE INDEX was 9.9 /hour.  0 events occurred in REM sleep and 97 events in NREM. The REM AHI was 0 /hour, versus a non-REM AHI of 10.1. The patient spent 74 minutes of total sleep time in the supine position and 278 minutes in non-supine. The supine AHI was 6.4 versus a non-supine AHI of 10.8.  OXYGEN SATURATION & C02:  The Wake baseline 02 saturation was 92%, with the lowest being 85%. Time spent below 89% saturation equaled 14 minutes. The arousals were noted as: 99 were spontaneous, 0 were associated with PLMs, and 29 were associated with respiratory events. Audio  and video analysis did not show any abnormal or unusual movements, behaviors, phonations or vocalizations.  No nocturia. Loud Snoring was noted. EKG was in keeping with normal sinus rhythm (NSR). EEG was performed in an expanded montage - and did not show seizure potential.    PERIODIC LIMB MOVEMENTS:  The patient had a total of 0 Periodic Limb Movements.  Post-study, the patient indicated that sleep was the same as usual.   IMPRESSION:  1. Mild Obstructive Sleep Apnea(OSA) and moderate snoring. 2. There was no parasomnia activity noted / recorded.    RECOMMENDATIONS:  1. Continue treatment by Klonopin which has suppressed sleep walking/ parasomnia.  There was no REM sleep activity. Consider dedicated sleep psychology referral if insomnia is of clinical concern.   2. A follow up appointment will be scheduled in the Sleep Clinic at [facility]. The referring provider will be notified of the results.      I certify that I have reviewed the entire raw data recording prior to the issuance of this report in accordance with the Standards of Accreditation of the American Academy of Sleep Medicine (AASM)     [name], MD     07-06-2017  Diplomat, American Board of Psychiatry and Neurology  Diplomat, American Board of [place] Director, [facility] [employer]

## 2017-07-07 NOTE — Telephone Encounter (Signed)
Called and went over the sleep study with the patient. I informed him of the findings and that she would recommend the patient to continue to use klonopin because that will suppress sleep walking. The patient states that he was only given that as a one time for anxiety. He wants to know if we will send a prescription in for him if that is what she recommends. I will send the copy of the sleep study for the patient per his request. The pharmacy we have on file is correct. I informed him that if Dr Brett Fairy decides to write the medication I will send that to the pharmacy that we have on file but in the event that she doesn't I would call him and let him know. Pt verbalized understanding.

## 2017-07-11 ENCOUNTER — Other Ambulatory Visit: Payer: Self-pay | Admitting: Nurse Practitioner

## 2017-07-11 DIAGNOSIS — F332 Major depressive disorder, recurrent severe without psychotic features: Secondary | ICD-10-CM

## 2017-07-13 ENCOUNTER — Other Ambulatory Visit: Payer: Self-pay | Admitting: Family Medicine

## 2017-07-13 DIAGNOSIS — F332 Major depressive disorder, recurrent severe without psychotic features: Secondary | ICD-10-CM

## 2017-07-13 MED ORDER — SERTRALINE HCL 100 MG PO TABS: ORAL_TABLET | ORAL | 0 refills | Status: DC

## 2017-08-11 ENCOUNTER — Other Ambulatory Visit: Payer: Self-pay | Admitting: Family Medicine

## 2017-08-11 DIAGNOSIS — F332 Major depressive disorder, recurrent severe without psychotic features: Secondary | ICD-10-CM

## 2017-08-11 MED ORDER — SERTRALINE HCL 100 MG PO TABS: ORAL_TABLET | ORAL | 0 refills | Status: DC

## 2017-09-10 ENCOUNTER — Other Ambulatory Visit: Payer: Self-pay | Admitting: Family Medicine

## 2017-09-10 DIAGNOSIS — F332 Major depressive disorder, recurrent severe without psychotic features: Secondary | ICD-10-CM

## 2017-09-10 MED ORDER — SERTRALINE HCL 100 MG PO TABS: ORAL_TABLET | ORAL | 0 refills | Status: DC

## 2017-10-08 ENCOUNTER — Other Ambulatory Visit: Payer: Self-pay | Admitting: Family Medicine

## 2017-10-08 DIAGNOSIS — F332 Major depressive disorder, recurrent severe without psychotic features: Secondary | ICD-10-CM

## 2017-10-09 ENCOUNTER — Other Ambulatory Visit: Payer: Self-pay | Admitting: Family Medicine

## 2017-10-09 DIAGNOSIS — F332 Major depressive disorder, recurrent severe without psychotic features: Secondary | ICD-10-CM

## 2017-10-09 MED ORDER — SERTRALINE HCL 100 MG PO TABS: ORAL_TABLET | ORAL | 0 refills | Status: DC

## 2017-11-16 ENCOUNTER — Other Ambulatory Visit: Payer: Self-pay | Admitting: Nurse Practitioner

## 2017-11-16 DIAGNOSIS — F332 Major depressive disorder, recurrent severe without psychotic features: Secondary | ICD-10-CM

## 2017-11-19 ENCOUNTER — Other Ambulatory Visit: Payer: Self-pay | Admitting: Family Medicine

## 2017-11-19 DIAGNOSIS — F332 Major depressive disorder, recurrent severe without psychotic features: Secondary | ICD-10-CM

## 2017-11-19 NOTE — Telephone Encounter (Signed)
Last seen 9 /7/18

## 2017-11-20 MED ORDER — SERTRALINE HCL 100 MG PO TABS: ORAL_TABLET | ORAL | 0 refills | Status: DC

## 2018-01-22 ENCOUNTER — Other Ambulatory Visit: Payer: Self-pay | Admitting: Family Medicine

## 2018-01-22 DIAGNOSIS — F332 Major depressive disorder, recurrent severe without psychotic features: Secondary | ICD-10-CM

## 2018-01-29 ENCOUNTER — Encounter: Payer: Self-pay | Admitting: Nurse Practitioner

## 2018-01-29 ENCOUNTER — Ambulatory Visit: Payer: BLUE CROSS/BLUE SHIELD | Admitting: Nurse Practitioner

## 2018-01-29 VITALS — BP 132/89 | HR 88 | Temp 97.6°F | Ht 69.0 in | Wt 269.0 lb

## 2018-01-29 DIAGNOSIS — F321 Major depressive disorder, single episode, moderate: Secondary | ICD-10-CM | POA: Diagnosis not present

## 2018-01-29 DIAGNOSIS — E785 Hyperlipidemia, unspecified: Secondary | ICD-10-CM | POA: Diagnosis not present

## 2018-01-29 DIAGNOSIS — F411 Generalized anxiety disorder: Secondary | ICD-10-CM | POA: Diagnosis not present

## 2018-01-29 DIAGNOSIS — F332 Major depressive disorder, recurrent severe without psychotic features: Secondary | ICD-10-CM

## 2018-01-29 DIAGNOSIS — Z6841 Body Mass Index (BMI) 40.0 and over, adult: Secondary | ICD-10-CM

## 2018-01-29 MED ORDER — CLONAZEPAM 0.5 MG PO TABS: 0.2500 mg | ORAL_TABLET | Freq: Every evening | ORAL | 2 refills | Status: DC | PRN

## 2018-01-29 MED ORDER — SERTRALINE HCL 100 MG PO TABS: 200.0000 mg | ORAL_TABLET | Freq: Every day | ORAL | 2 refills | Status: DC

## 2018-01-29 NOTE — Progress Notes (Signed)
Subjective:    Patient ID: Michael Roman, male    DOB: 05-Aug-1990, 27 y.o.   MRN: [id number]   Chief Complaint: Medical Management of Chronic Issues   HPI:  1. Hyperlipidemia, unspecified hyperlipidemia type  Is currently on no meds . Is suppose to be watching diet. Does very little exercise.  2. Morbid obesity with BMI of 40.0-44.9, adult ([place])  Weight unchanged  3. Generalized anxiety disorder  Takes klonopin daily when needed  4. Depression, major, single episode, moderate (HCC)  Is currently on zoloft 14m  2 daily. No side effects. Helps him some. He has been seeing a therapist and they are saying that he may need to change to another psychiartrist.     Outpatient Encounter Medications as of 01/29/2018  Medication Sig  . cetirizine (ZYRTEC) 10 MG tablet Take 10 mg by mouth 3 times/day as needed-between meals & bedtime for allergies.  .Marland Kitchensertraline (ZOLOFT) 100 MG tablet TAKE 2 TABLETS ONCE A DAY  . clonazePAM (KLONOPIN) 0.5 MG tablet Take 0.5 tablets (0.25 mg total) by mouth at bedtime as needed for anxiety. (Patient not taking: Reported on 01/29/2018)       New complaints: None today  Social history: Lives at home with parents and drives back and forth to [place] to work.   Review of Systems  Constitutional: Negative for activity change and appetite change.  HENT: Negative.   Eyes: Negative for pain.  Respiratory: Negative for shortness of breath.   Cardiovascular: Negative for chest pain, palpitations and leg swelling.  Gastrointestinal: Negative for abdominal pain.  Endocrine: Negative for polydipsia.  Genitourinary: Negative.   Skin: Negative for rash.  Neurological: Negative for dizziness, weakness and headaches.  Hematological: Does not bruise/bleed easily.  Psychiatric/Behavioral: Negative.   All other systems reviewed and are negative.      Objective:   Physical Exam  Constitutional: He is oriented to person, place, and time. He appears  well-developed and well-nourished. No distress.  Cardiovascular: Normal rate and regular rhythm.  Pulmonary/Chest: Effort normal.  Neurological: He is alert and oriented to person, place, and time.  Skin: Skin is warm.  Psychiatric: He has a normal mood and affect. His behavior is normal. Judgment and thought content normal.  Good eye contact Answers all questions appropriately    BP 132/89   Pulse 88   Temp 97.6 F (36.4 C) (Oral)   Ht 5' 9"  (1.753 m)   Wt 269 lb (122 kg)   BMI 39.72 kg/m        Assessment & Plan:  DMalikai Gutcomes in today with chief complaint of Medical Management of Chronic Issues   Diagnosis and orders addressed:  1. Hyperlipidemia, unspecified hyperlipidemia type Low fat diet - CMP14+EGFR - Lipid panel  2. Morbid obesity with BMI of 40.0-44.9, [place] Discussed diet and exercise for person with BMI >25 Will recheck weight in 3-6 months  3. Generalized anxiety disorder Stress management - Ambulatory referral to Psychiatry  4. Depression, major, single episode, moderate ([place] - Ambulatory referral to Psychiatry  5. Severe episode of recurrent major depressive disorder, without psychotic features ([place] - sertraline (ZOLOFT) 100 MG tablet; Take 2 tablets (200 mg total) by mouth daily.  Dispense: 60 tablet; Refill: 2 - clonazePAM (KLONOPIN) 0.5 MG tablet; Take 0.5 tablets (0.25 mg total) by mouth at bedtime as needed for anxiety.  Dispense: 30 tablet; Refill: 2   Labs pending Health Maintenance reviewed Diet and exercise encouraged  Follow up plan: 6  months   Mary-Margaret Hassell Done, FNP

## 2018-01-29 NOTE — Addendum Note (Signed)
Addended by: Chevis Pretty on: 01/29/2018 03:18 PM   Modules accepted: Orders

## 2018-01-30 LAB — LIPID PANEL
CHOLESTEROL TOTAL: 187 mg/dL (ref 100–199)
Chol/HDL Ratio: 6 ratio — ABNORMAL HIGH (ref 0.0–5.0)
HDL: 31 mg/dL — AB (ref >39)
LDL CALC: 106 mg/dL — AB (ref 0–99)
Triglycerides: 252 mg/dL — ABNORMAL HIGH (ref 0–149)
VLDL CHOLESTEROL CAL: 50 mg/dL — AB (ref 5–40)

## 2018-01-30 LAB — CMP14+EGFR
ALBUMIN: 4.9 g/dL (ref 3.5–5.5)
ALT: 83 IU/L — AB (ref 0–44)
AST: 52 IU/L — ABNORMAL HIGH (ref 0–40)
Albumin/Globulin Ratio: 1.6 (ref 1.2–2.2)
Alkaline Phosphatase: 56 IU/L (ref 39–117)
BILIRUBIN TOTAL: 0.3 mg/dL (ref 0.0–1.2)
BUN / CREAT RATIO: 10 (ref 9–20)
BUN: 11 mg/dL (ref 6–20)
CHLORIDE: 104 mmol/L (ref 96–106)
CO2: 20 mmol/L (ref 20–29)
CREATININE: 1.06 mg/dL (ref 0.76–1.27)
Calcium: 10.1 mg/dL (ref 8.7–10.2)
GFR calc non Af Amer: 97 mL/min/{1.73_m2} (ref >59)
GFR, EST AFRICAN AMERICAN: 112 mL/min/{1.73_m2} (ref >59)
GLUCOSE: 94 mg/dL (ref 65–99)
Globulin, Total: 3 g/dL (ref 1.5–4.5)
Potassium: 4.1 mmol/L (ref 3.5–5.2)
Sodium: 145 mmol/L — ABNORMAL HIGH (ref 134–144)
TOTAL PROTEIN: 7.9 g/dL (ref 6.0–8.5)

## 2018-04-07 ENCOUNTER — Encounter (HOSPITAL_COMMUNITY): Payer: Self-pay | Admitting: Psychiatry

## 2018-04-07 ENCOUNTER — Ambulatory Visit (INDEPENDENT_AMBULATORY_CARE_PROVIDER_SITE_OTHER): Payer: BLUE CROSS/BLUE SHIELD | Admitting: Psychiatry

## 2018-04-07 VITALS — BP 124/76 | Ht 69.0 in | Wt 269.0 lb

## 2018-04-07 DIAGNOSIS — F4010 Social phobia, unspecified: Secondary | ICD-10-CM

## 2018-04-07 DIAGNOSIS — F411 Generalized anxiety disorder: Secondary | ICD-10-CM | POA: Diagnosis not present

## 2018-04-07 MED ORDER — BUPROPION HCL ER (XL) 150 MG PO TB24
150.0000 mg | ORAL_TABLET | Freq: Every day | ORAL | 1 refills | Status: DC
Start: 2018-04-07 — End: 2018-06-07

## 2018-04-07 NOTE — Progress Notes (Signed)
Psychiatric Initial Adult Assessment   Patient Identification: Michael Roman MRN:  [id number] Date of Evaluation:  04/07/2018 Referral Source: Therapist Isabel Caprice.  Chief Complaint:  I have anxiety and nervousness.  I do to change my medication.  I do not think my current medicine working.  Visit Diagnosis:    ICD-10-CM   1. GAD (generalized anxiety disorder) F41.1 buPROPion (WELLBUTRIN XL) 150 MG 24 hr tablet  2. Social anxiety disorder F40.10 buPROPion (WELLBUTRIN XL) 150 MG 24 hr tablet    History of Present Illness: Michael Roman is a 27 year old Caucasian, employed single man who is referred from his therapist Candace for seeking management for his anxiety and depression.  Patient is taking Zoloft 200 mg daily and Klonopin 0.5 mg as needed for panic attacks but he does not feel his medicine working.  His therapist also endorsed that he might need to change the medication since he continued to have anxiety and lack of confidence.  Patient endorses taking Zoloft since the fifth grade but there has been time when he change the dosage but he never tried any other medication.  He feel lack of motivation, lack of confidence, avoid making friends, get very anxious around public and strangers and feel detached and leave the place when he is very anxious.  He feels people judge him when he is outside and doing things.  He admitted poor sleep.  He is able to fall asleep but does not stay in sleep for long.  He also endorsed being shy and nervous around people.  Though he denies any suicidal thoughts but he has history of feeling hopelessness and worthlessness.  Never had any suicidal attempt.  Had a history of panic attack however since taking Klonopin as needed he has no panic attack in recent months.  He is concerned about his loneliness, lack of friends and social network and obesity.  In 1 year he had gained more than 15 pounds.  He admitted lack of exercise and sitting in computer desk had contributed  his weight gain.  However he also endorsed that he is not happy with his current situation.  He lives with his parents and currently not in any romantic relationship.  He has no children..  He has no motivation to do things.  He used to fish and camping however has not done in recent weeks.  He admitted low self-esteem and decreased ego but denies any paranoia, hallucination, aggression, violence or any mania.  He admitted social drinking but denies any binge, intoxication or any withdrawal symptoms.  He denies any illegal substance use.  He is working as a Chief Technology Officer.  He admitted in the beginning he has a lot of difficulty as being very anxious but now he is handling his job much better.  He has no history of physical sexual or verbal emotional abuse.  He has no nightmares or any flashback.  He admitted some time irritability but no aggressive for violence.  He is open to try a different medication.  Associated Signs/Symptoms: Depression Symptoms:  depressed mood, anhedonia, insomnia, fatigue, feelings of worthlessness/guilt, difficulty concentrating, hopelessness, anxiety, panic attacks, loss of energy/fatigue, disturbed sleep, weight gain, (Hypo) Manic Symptoms:  Distractibility, Anxiety Symptoms:  Excessive Worry, Panic Symptoms, Social Anxiety, Psychotic Symptoms:  No psychotic symptoms. PTSD Symptoms: Negative  Past Psychiatric History: No history of suicidal attempt, psychosis, mania, hallucination or any paranoia.  No history of abuse.  Taking Zoloft since fifth grade and has been on and  off in therapy.  Never tried any other medication other than Zoloft.  Previous Psychotropic Medications: Yes   Substance Abuse History in the last 12 months:  No.  Consequences of Substance Abuse: Negative  Past Medical History:  Past Medical History:  Diagnosis Date  . Depression   . Elevated liver function tests     Past Surgical History:  Procedure  Laterality Date  . SHOULDER ARTHROSCOPY W/ LABRAL REPAIR     x2  . WISDOM TOOTH EXTRACTION      Family Psychiatric History: Michael Roman was alcoholic.    Family History:  Family History  Problem Relation Age of Onset  . Hypertension Mother   . Hypertension Father   . Colon cancer Neg Hx   . Stomach cancer Neg Hx     Social History:   Social History   Socioeconomic History  . Marital status: Single    Spouse name: Not on file  . Number of children: Not on file  . Years of education: Not on file  . Highest education level: Not on file  Occupational History  . Occupation: Chief Executive Officer   Social Needs  . Financial resource strain: Not on file  . Food insecurity:    Worry: Not on file    Inability: Not on file  . Transportation needs:    Medical: Not on file    Non-medical: Not on file  Tobacco Use  . Smoking status: Never Smoker  . Smokeless tobacco: Never Used  Substance and Sexual Activity  . Alcohol use: No  . Drug use: No  . Sexual activity: Not Currently  Lifestyle  . Physical activity:    Days per week: Not on file    Minutes per session: Not on file  . Stress: Not on file  Relationships  . Social connections:    Talks on phone: Not on file    Gets together: Not on file    Attends religious service: Not on file    Active member of club or organization: Not on file    Attends meetings of clubs or organizations: Not on file    Relationship status: Not on file  Other Topics Concern  . Not on file  Social History Narrative  . Not on file    Additional Social History:  Born in [place] and raised in [place] and [place].  He never married and he has no children.  He lives with his parents.  He has a younger sister and denies any issues with sibling.  He finished undergraduate I do not recall any major issues in the school.   Allergies:   Allergies  Allergen Reactions  . Vantin [Cefpodoxime]     Metabolic Disorder Labs: No  results found for: HGBA1C, MPG No results found for: PROLACTIN Lab Results  Component Value Date   CHOL 187 01/29/2018   TRIG 252 (H) 01/29/2018   HDL 31 (L) 01/29/2018   CHOLHDL 6.0 (H) 01/29/2018   VLDL 33.0 03/13/2017   LDLCALC 106 (H) 01/29/2018   LDLCALC 144 (H) 03/13/2017   Lab Results  Component Value Date   TSH 2.030 07/03/2016    Therapeutic Level Labs: No results found for: LITHIUM No results found for: CBMZ No results found for: VALPROATE  Current Medications: Current Outpatient Medications  Medication Sig Dispense Refill  . cetirizine (ZYRTEC) 10 MG tablet Take 10 mg by mouth 3 times/day as needed-between meals & bedtime for allergies.    . clonazePAM (KLONOPIN) 0.5 MG  tablet Take 0.5 tablets (0.25 mg total) by mouth at bedtime as needed for anxiety. 30 tablet 2  . sertraline (ZOLOFT) 100 MG tablet Take 2 tablets (200 mg total) by mouth daily. 60 tablet 2   No current facility-administered medications for this visit.     Musculoskeletal: Strength & Muscle Tone: within normal limits Gait & Station: normal Patient leans: N/A  Psychiatric Specialty Exam: ROS  There were no vitals taken for this visit.There is no height or weight on file to calculate BMI.  General Appearance: shy and overweight  Eye Contact:  Fair  Speech:  Slow  Volume:  Normal  Mood:  Anxious and Dysphoric  Affect:  Congruent  Thought Process:  Goal Directed  Orientation:  Full (Time, Place, and Person)  Thought Content:  Obsessions and Rumination  Suicidal Thoughts:  No  Homicidal Thoughts:  No  Memory:  Immediate;   Good Recent;   Good Remote;   Good  Judgement:  Good  Insight:  Good  Psychomotor Activity:  Normal  Concentration:  Concentration: Fair and Attention Span: Fair  Recall:  Good  Fund of Knowledge:Good  Language: Good  Akathisia:  No  Handed:  Right  AIMS (if indicated):  not done  Assets:  Communication Skills Desire for Improvement Financial  Resources/Insurance Housing Resilience Social Support  ADL's:  Intact  Cognition: WNL  Sleep:  Fair   Screenings: GAD-7     Office Visit from 10/04/2015 in [place]  Total GAD-7 Score  0    PHQ2-9     Office Visit from 01/29/2018 in [place] [facility] Office Visit from 01/02/2017 in [place] Visit from 12/22/2016 in [place] Visit from 08/12/2016 in [place]  PHQ-2 Total Score  0  0  0  6  0  PHQ-9 Total Score  -  -  -  23  -      Assessment and Plan: Generalized anxiety disorder.  Social anxiety disorder.  Panic attacks.  Alcario Drought is a 27 year old Caucasian, single, employed man who is referred from his therapist for seeking management for his residual symptoms of anxiety.  He is taking Zoloft 200 mg and Klonopin 0.5 mg as needed for panic attacks.  He continues to have symptoms of anxiety and nervousness.  I recommended to try Wellbutrin XL 150 mg in the morning to help his energy, concentration, motivation and residual symptoms of depression and anxiety.  I also recommend to reduce Zoloft to 100 mg and if patient tolerated well with good response of Wellbutrin we may consider going to 300 mg daily.  We will continue Klonopin 0.5 mg as needed for severe anxiety.  His Klonopin and Zoloft was given by primary care physician.  I also discussed his blood work results.  He has high cholesterol and high liver enzymes.  Patient has a history of cholecystectomy.  Recommend to repeat his blood work since he has high AST and ALT.  Encourage healthy lifestyle and watch his calorie intake.  He had gained 15 pounds in 1 year.  Patient is seeing therapist and I encouraged to continue counseling with Isabel Caprice for CBT.  Discussed safety concerns at any time having active suicidal thoughts or homicidal  thought that he need to call 911 or go to local emergency room.  Follow-up in 4 to 6 weeks.  Medication side effect specially Wellbutrin in the  beginning can cause tremors shakes headaches and nausea.   [name], MD 12/11/20191:26 PM

## 2018-04-13 ENCOUNTER — Ambulatory Visit: Payer: BLUE CROSS/BLUE SHIELD | Admitting: Family Medicine

## 2018-04-13 ENCOUNTER — Encounter: Payer: Self-pay | Admitting: Family Medicine

## 2018-04-13 VITALS — BP 155/93 | HR 121 | Temp 98.9°F | Ht 69.0 in | Wt 267.0 lb

## 2018-04-13 DIAGNOSIS — R6889 Other general symptoms and signs: Secondary | ICD-10-CM | POA: Diagnosis not present

## 2018-04-13 DIAGNOSIS — R509 Fever, unspecified: Secondary | ICD-10-CM

## 2018-04-13 LAB — VERITOR FLU A/B WAIVED
Influenza A: NEGATIVE
Influenza B: NEGATIVE

## 2018-04-13 NOTE — Patient Instructions (Signed)
Your flu test was negative.  We discussed consideration for chest x-ray if the breathing worsens.  I recommend that you proceed with what you have been doing for cough and cold.  Consider adding Mucinex for cough and congestion.  If symptoms worsen or do not improve, please do not hesitate to seek immediate medical attention.  It appears that you have a viral upper respiratory infection (cold).  Cold symptoms can last up to 2 weeks.    - Get plenty of rest and drink plenty of fluids. - Try to breathe moist air. Use a cold mist humidifier. - Consume warm fluids (soup or tea) to provide relief for a stuffy nose and to loosen phlegm. - For nasal stuffiness, try saline nasal spray or a Neti Pot. Afrin nasal spray can also be used but this product should not be used longer than 3 days or it will cause rebound nasal stuffiness (worsening nasal congestion). - For sore throat pain relief: use chloraseptic spray, suck on throat lozenges, hard candy or popsicles; gargle with warm salt water (1/4 tsp. salt per 8 oz. of water); and eat soft, bland foods. - Eat a well-balanced diet. If you cannot, ensure you are getting enough nutrients by taking a daily multivitamin. - Avoid dairy products, as they can thicken phlegm. - Avoid alcohol, as it impairs your body's immune system.  CONTACT YOUR DOCTOR IF YOU EXPERIENCE ANY OF THE FOLLOWING: - High fever - Ear pain - Sinus-type headache - Unusually severe cold symptoms - Cough that gets worse while other cold symptoms improve - Flare up of any chronic lung problem, such as asthma - Your symptoms persist longer than 2 weeks

## 2018-04-13 NOTE — Progress Notes (Signed)
Subjective: CC: ?Flu PCP: Chevis Pretty, FNP AUQ:JFHLK Berns is a 27 y.o. male presenting to clinic today for:  1. Flu like symptoms Patient reports onset of productive cough, sweating, subjective fevers and sore throat Sunday.  He developed myalgia yesterday and felt pretty bad yesterday.  He reports difficulty taking a deep breath in yesterday and states that the phlegm was green.  He feels like the phlegm color has improved today and he feels that the breathing has improved today.  He had asthma as a child but outgrew this.  He is a non-smoker.  Denies any hemoptysis.  No known sick contacts.  No nausea, vomiting, diarrhea.  He has been using Tylenol and Advil to control fever and myalgia.  Last dose was this morning.  He has not gotten his flu shot this year.   ROS: Per HPI  Allergies  Allergen Reactions  . Vantin [Cefpodoxime]    Past Medical History:  Diagnosis Date  . Depression   . Elevated liver function tests     Current Outpatient Medications:  .  buPROPion (WELLBUTRIN XL) 150 MG 24 hr tablet, Take 1 tablet (150 mg total) by mouth daily., Disp: 30 tablet, Rfl: 1 .  cetirizine (ZYRTEC) 10 MG tablet, Take 10 mg by mouth 3 times/day as needed-between meals & bedtime for allergies., Disp: , Rfl:  .  clonazePAM (KLONOPIN) 0.5 MG tablet, Take 0.5 tablets (0.25 mg total) by mouth at bedtime as needed for anxiety., Disp: 30 tablet, Rfl: 2 .  sertraline (ZOLOFT) 100 MG tablet, Take 100 mg by mouth daily., Disp: , Rfl:  Social History   Socioeconomic History  . Marital status: Single    Spouse name: Not on file  . Number of children: Not on file  . Years of education: Not on file  . Highest education level: Not on file  Occupational History  . Occupation: Chief Executive Officer   Social Needs  . Financial resource strain: Not on file  . Food insecurity:    Worry: Not on file    Inability: Not on file  . Transportation needs:    Medical: Not on file    Non-medical: Not  on file  Tobacco Use  . Smoking status: Never Smoker  . Smokeless tobacco: Never Used  Substance and Sexual Activity  . Alcohol use: No  . Drug use: No  . Sexual activity: Not Currently  Lifestyle  . Physical activity:    Days per week: Not on file    Minutes per session: Not on file  . Stress: Not on file  Relationships  . Social connections:    Talks on phone: Not on file    Gets together: Not on file    Attends religious service: Not on file    Active member of club or organization: Not on file    Attends meetings of clubs or organizations: Not on file    Relationship status: Not on file  . Intimate partner violence:    Fear of current or ex partner: Not on file    Emotionally abused: Not on file    Physically abused: Not on file    Forced sexual activity: Not on file  Other Topics Concern  . Not on file  Social History Narrative  . Not on file   Family History  Problem Relation Age of Onset  . Hypertension Mother   . Hypertension Father   . Colon cancer Neg Hx   . Stomach cancer Neg Hx  Objective: Office vital signs reviewed. BP (!) 155/93   Pulse (!) 121   Temp 98.9 F (37.2 C) (Oral)   Ht 5' 9"  (1.753 m)   Wt 267 lb (121.1 kg)   SpO2 96%   BMI 39.43 kg/m   Physical Examination:  General: Awake, alert, well nourished, ill appearing. Sweaty. No acute distress HEENT: Normal    Neck: No masses palpated. No lymphadenopathy    Ears: Tympanic membranes intact, normal light reflex, no erythema, no bulging    Eyes: PERRLA, extraocular membranes intact, sclera white    Nose: nasal turbinates moist, clear nasal discharge    Throat: moist mucus membranes, no erythema, no tonsillar exudate.  Airway is patent Cardio: tachycardic w/ regular rhythm, S1S2 heard, no murmurs appreciated Pulm: clear to auscultation bilaterally, no wheezes, rhonchi or rales; normal work of breathing on room air Skin: clammy  Assessment/ Plan: 27 y.o. male   1. Flu-like  symptoms Patient is afebrile but does have elevated heart rate during visit.  He also has elevated blood pressure.  His rapid flu was negative.  Physical exam was unremarkable except for clammy skin and elevated heart rate.  His work of breathing and pulse ox on room air were normal.  This is likely a viral illness but we did discuss consideration for chest x-ray if he starts developing worsening cough or any respiratory symptoms that are unusual.  He voiced good understanding; okay to proceed with supportive care at home.  We discussed use of Naprosyn in place of Motrin.  Okay to continue alternating with Tylenol if needed.  Push oral fluids.  Rest.  Work note provided.  Reasons for return emerge evaluation emergency department discussed.  He will follow-up PRN. - Veritor Flu A/B Waived  2. Fever, unspecified fever cause - Veritor Flu A/B Waived    Orders Placed This Encounter  Procedures  . Veritor Flu A/B Waived    Order Specific Question:   Source    Answer:   nasal   No orders of the defined types were placed in this encounter.    Janora Norlander, DO [place] [phone]

## 2018-06-07 ENCOUNTER — Ambulatory Visit (INDEPENDENT_AMBULATORY_CARE_PROVIDER_SITE_OTHER): Payer: BLUE CROSS/BLUE SHIELD | Admitting: Psychiatry

## 2018-06-07 ENCOUNTER — Encounter (HOSPITAL_COMMUNITY): Payer: Self-pay | Admitting: Psychiatry

## 2018-06-07 DIAGNOSIS — F411 Generalized anxiety disorder: Secondary | ICD-10-CM

## 2018-06-07 DIAGNOSIS — F4010 Social phobia, unspecified: Secondary | ICD-10-CM

## 2018-06-07 MED ORDER — BUPROPION HCL ER (XL) 300 MG PO TB24: 300.0000 mg | ORAL_TABLET | Freq: Every day | ORAL | 0 refills | Status: DC

## 2018-06-07 NOTE — Progress Notes (Signed)
[facility] MD/PA/NP OP Progress Note  06/07/2018 2:58 PM Michael Roman  MRN:  [id number]  Chief Complaint: I like new medication.  It is helping my motivation and energy.  HPI: Michael Roman came for his appointment.  He is a 28 year old Caucasian employed single man who is referred from his therapist.  He was taking Zoloft 200 mg daily and Klonopin 0.5 mg as needed for panic attack but he has not seen any improvement.  He was taking Zoloft since ninth grade but he had never tried any other medication.  We started him on Wellbutrin XL 150 mg daily.  We cut down his Zoloft to 100 mg only.  He is seen improvement.  He see increased energy level, more motivation, less anxious, less overwhelmed and able to do things.  Has not taken Klonopin since the last visit because he does not have panic attack.  He still struggles in the public place when he feels people judge him and he is shy around people.  He denies any suicidal thoughts or homicidal thought.  Denies any feeling of hopelessness or worthlessness.  He is tolerating Wellbutrin XL 150 mg daily.  He is sleeping good.  He has more motivation to do things.  He is okay to try a higher dose of Wellbutrin.  He is seeing his therapist Isabel Caprice for CBT.  Recently he seen his physician for flu.  He had a schedule to see his primary care for blood work in April.  Patient denies drinking or using any illegal substances.  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  He lives with his parents.  Visit Diagnosis:    ICD-10-CM   1. GAD (generalized anxiety disorder) F41.1 buPROPion (WELLBUTRIN XL) 300 MG 24 hr tablet  2. Social anxiety disorder F40.10 buPROPion (WELLBUTRIN XL) 300 MG 24 hr tablet    Past Psychiatric History: Reviewed. No H/O suicidal attempt, psychosis, mania, hallucination or any paranoia.  No H/O abuse. Took Zoloft since fifth grade but stopped working.   Past Medical History:  Past Medical History:  Diagnosis Date  . Depression    . Elevated liver function tests     Past Surgical History:  Procedure Laterality Date  . SHOULDER ARTHROSCOPY W/ LABRAL REPAIR     x2  . WISDOM TOOTH EXTRACTION      Family Psychiatric History: Reviewed.  Family History:  Family History  Problem Relation Age of Onset  . Hypertension Mother   . Hypertension Father   . Colon cancer Neg Hx   . Stomach cancer Neg Hx     Social History:  Social History   Socioeconomic History  . Marital status: Single    Spouse name: Not on file  . Number of children: Not on file  . Years of education: Not on file  . Highest education level: Not on file  Occupational History  . Occupation: Chief Executive Officer   Social Needs  . Financial resource strain: Not on file  . Food insecurity:    Worry: Not on file    Inability: Not on file  . Transportation needs:    Medical: Not on file    Non-medical: Not on file  Tobacco Use  . Smoking status: Never Smoker  . Smokeless tobacco: Never Used  Substance and Sexual Activity  . Alcohol use: No  . Drug use: No  . Sexual activity: Not Currently  Lifestyle  . Physical activity:    Days per week: Not on file  Minutes per session: Not on file  . Stress: Not on file  Relationships  . Social connections:    Talks on phone: Not on file    Gets together: Not on file    Attends religious service: Not on file    Active member of club or organization: Not on file    Attends meetings of clubs or organizations: Not on file    Relationship status: Not on file  Other Topics Concern  . Not on file  Social History Narrative  . Not on file    Allergies:  Allergies  Allergen Reactions  . Vantin [Cefpodoxime]     Metabolic Disorder Labs: No results found for: HGBA1C, MPG No results found for: PROLACTIN Lab Results  Component Value Date   CHOL 187 01/29/2018   TRIG 252 (H) 01/29/2018   HDL 31 (L) 01/29/2018   CHOLHDL 6.0 (H) 01/29/2018   VLDL 33.0 03/13/2017   LDLCALC 106 (H) 01/29/2018    LDLCALC 144 (H) 03/13/2017   Lab Results  Component Value Date   TSH 2.030 07/03/2016    Therapeutic Level Labs: No results found for: LITHIUM No results found for: VALPROATE No components found for:  CBMZ  Current Medications: Current Outpatient Medications  Medication Sig Dispense Refill  . buPROPion (WELLBUTRIN XL) 150 MG 24 hr tablet Take 1 tablet (150 mg total) by mouth daily. 30 tablet 1  . cetirizine (ZYRTEC) 10 MG tablet Take 10 mg by mouth 3 times/day as needed-between meals & bedtime for allergies.    . clonazePAM (KLONOPIN) 0.5 MG tablet Take 0.5 tablets (0.25 mg total) by mouth at bedtime as needed for anxiety. 30 tablet 2  . sertraline (ZOLOFT) 100 MG tablet Take 100 mg by mouth daily.     No current facility-administered medications for this visit.      Musculoskeletal: Strength & Muscle Tone: within normal limits Gait & Station: normal Patient leans: N/A  Psychiatric Specialty Exam: ROS  Blood pressure (!) 154/95, pulse 88, height 5' 9"  (1.753 m), weight 268 lb (121.6 kg).There is no height or weight on file to calculate BMI.  General Appearance: Casual  Eye Contact:  Fair  Speech:  Slow  Volume:  Normal  Mood:  Anxious  Affect:  Congruent  Thought Process:  Goal Directed  Orientation:  Full (Time, Place, and Person)  Thought Content: Logical   Suicidal Thoughts:  No  Homicidal Thoughts:  No  Memory:  Immediate;   Good Recent;   Good Remote;   Good  Judgement:  Good  Insight:  Good  Psychomotor Activity:  Normal  Concentration:  Concentration: Good and Attention Span: Good  Recall:  Good  Fund of Knowledge: Good  Language: Good  Akathisia:  No  Handed:  Right  AIMS (if indicated): not done  Assets:  Communication Skills Desire for [place] Talents/Skills Transportation  ADL's:  Intact  Cognition: WNL  Sleep:  Good   Screenings: GAD-7     Office Visit from 10/04/2015 in [facility]  Total GAD-7 Score  0    PHQ2-9     Office Visit from 04/13/2018 in [facility] Visit from 01/29/2018 in [facility] from [facility] Office Visit from 01/02/2017 in [place] Visit from 12/22/2016 in [facility]  PHQ-2 Total Score  0  0  0  0  6  PHQ-9 Total Score  0  -  -  -  23       Assessment and Plan: Social anxiety disorder, panic attacks.  Generalized anxiety disorder.  Patient doing better on Wellbutrin XL 150 mg daily.  He like to try higher dose.  Recommended to increase Wellbutrin XL 300 mg daily.  Discontinue Zoloft.  Continue Klonopin 0.5 mg as needed for panic attacks.  Encouraged to keep appointment with his primary care physician for blood work.  Patient has a high liver enzymes.  Encouraged to continue therapy with Isabel Caprice.  Recommended to call us back if is any question or any concern.  Follow-up in 3 months.   [name], MD 06/07/2018, 2:58 PM

## 2018-07-27 ENCOUNTER — Other Ambulatory Visit: Payer: Self-pay

## 2018-07-27 ENCOUNTER — Encounter: Payer: Self-pay | Admitting: Nurse Practitioner

## 2018-07-27 ENCOUNTER — Ambulatory Visit (INDEPENDENT_AMBULATORY_CARE_PROVIDER_SITE_OTHER): Payer: BLUE CROSS/BLUE SHIELD | Admitting: Nurse Practitioner

## 2018-07-27 VITALS — BP 130/91 | HR 102 | Temp 97.3°F | Ht 69.0 in | Wt 255.0 lb

## 2018-07-27 DIAGNOSIS — E785 Hyperlipidemia, unspecified: Secondary | ICD-10-CM | POA: Diagnosis not present

## 2018-07-27 DIAGNOSIS — F321 Major depressive disorder, single episode, moderate: Secondary | ICD-10-CM | POA: Diagnosis not present

## 2018-07-27 DIAGNOSIS — F411 Generalized anxiety disorder: Secondary | ICD-10-CM | POA: Diagnosis not present

## 2018-07-27 DIAGNOSIS — Z6841 Body Mass Index (BMI) 40.0 and over, adult: Secondary | ICD-10-CM

## 2018-07-27 LAB — LIPID PANEL
Chol/HDL Ratio: 5.1 ratio — ABNORMAL HIGH (ref 0.0–5.0)
Cholesterol, Total: 154 mg/dL (ref 100–199)
HDL: 30 mg/dL — ABNORMAL LOW (ref >39)
LDL Calculated: 91 mg/dL (ref 0–99)
Triglycerides: 163 mg/dL — ABNORMAL HIGH (ref 0–149)
VLDL Cholesterol Cal: 33 mg/dL (ref 5–40)

## 2018-07-27 LAB — CMP14+EGFR
ALT: 61 IU/L — ABNORMAL HIGH (ref 0–44)
AST: 34 IU/L (ref 0–40)
Albumin/Globulin Ratio: 1.8 (ref 1.2–2.2)
Albumin: 4.9 g/dL (ref 4.1–5.2)
Alkaline Phosphatase: 50 IU/L (ref 39–117)
BUN/Creatinine Ratio: 8 — ABNORMAL LOW (ref 9–20)
BUN: 9 mg/dL (ref 6–20)
Bilirubin Total: 0.6 mg/dL (ref 0.0–1.2)
CO2: 25 mmol/L (ref 20–29)
CREATININE: 1.15 mg/dL (ref 0.76–1.27)
Calcium: 9.9 mg/dL (ref 8.7–10.2)
Chloride: 102 mmol/L (ref 96–106)
GFR calc Af Amer: 101 mL/min/{1.73_m2} (ref >59)
GFR calc non Af Amer: 87 mL/min/{1.73_m2} (ref >59)
Globulin, Total: 2.8 g/dL (ref 1.5–4.5)
Glucose: 98 mg/dL (ref 65–99)
Potassium: 4.6 mmol/L (ref 3.5–5.2)
Sodium: 142 mmol/L (ref 134–144)
TOTAL PROTEIN: 7.7 g/dL (ref 6.0–8.5)

## 2018-07-27 MED ORDER — CLONAZEPAM 0.5 MG PO TABS: 0.2500 mg | ORAL_TABLET | Freq: Every evening | ORAL | 2 refills | Status: DC | PRN

## 2018-07-27 MED ORDER — BUPROPION HCL ER (XL) 300 MG PO TB24: 300.0000 mg | ORAL_TABLET | Freq: Every day | ORAL | 0 refills | Status: DC

## 2018-07-27 NOTE — Progress Notes (Signed)
Subjective:    Patient ID: Michael Roman, male    DOB: 05/15/1990, 28 y.o.   MRN: [id number]   Chief Complaint: Medical Management of Chronic Issues   HPI:  1. Hyperlipidemia, unspecified hyperlipidemia type  Has been watching diet and exercise.  2. Generalized anxiety disorder  Has been doing much better overall. He has klonopin but does not take everyday.  3. Depression, major, single episode, moderate ([place])  Has been on wellbutrin for over a year and it has really helped him a lot. Depression screen [facility] 2/9 07/27/2018 04/13/2018 01/29/2018  Decreased Interest 0 0 0  Down, Depressed, Hopeless 0 0 0  PHQ - 2 Score 0 0 0  Altered sleeping - 0 -  Tired, decreased energy - 0 -  Change in appetite - 0 -  Feeling bad or failure about yourself  - 0 -  Trouble concentrating - 0 -  Moving slowly or fidgety/restless - 0 -  Suicidal thoughts - 0 -  PHQ-9 Score - 0 -  Difficult doing work/chores - Not difficult at all -     4. Morbid obesity with BMI of 40.0-44.9, adult ([place])  Weight is down 12lbs since last visit.    Outpatient Encounter Medications as of 07/27/2018  Medication Sig  . buPROPion (WELLBUTRIN XL) 300 MG 24 hr tablet Take 1 tablet (300 mg total) by mouth daily.  . cetirizine (ZYRTEC) 10 MG tablet Take 10 mg by mouth 3 times/day as needed-between meals & bedtime for allergies.  . clonazePAM (KLONOPIN) 0.5 MG tablet Take 0.5 tablets (0.25 mg total) by mouth at bedtime as needed for anxiety.       New complaints: None today  Social history: Was layed off from work yesterday due to covid 43.  Review of Systems  Constitutional: Negative for activity change and appetite change.  HENT: Negative.   Eyes: Negative for pain.  Respiratory: Negative for shortness of breath.   Cardiovascular: Negative for chest pain, palpitations and leg swelling.  Gastrointestinal: Negative for abdominal pain.  Endocrine: Negative for polydipsia.  Genitourinary: Negative.   Skin:  Negative for rash.  Neurological: Negative for dizziness, weakness and headaches.  Hematological: Does not bruise/bleed easily.  Psychiatric/Behavioral: Negative.   All other systems reviewed and are negative.      Objective:   Physical Exam Vitals signs and nursing note reviewed.  Constitutional:      Appearance: Normal appearance. He is well-developed.  HENT:     Head: Normocephalic.     Nose: Nose normal.  Eyes:     Pupils: Pupils are equal, round, and reactive to light.  Neck:     Musculoskeletal: Normal range of motion and neck supple.     Thyroid: No thyroid mass or thyromegaly.     Vascular: No carotid bruit or JVD.     Trachea: Phonation normal.  Cardiovascular:     Rate and Rhythm: Normal rate and regular rhythm.  Pulmonary:     Effort: Pulmonary effort is normal. No respiratory distress.     Breath sounds: Normal breath sounds.  Abdominal:     General: Bowel sounds are normal.     Palpations: Abdomen is soft.     Tenderness: There is no abdominal tenderness.  Musculoskeletal: Normal range of motion.  Lymphadenopathy:     Cervical: No cervical adenopathy.  Skin:    General: Skin is warm and dry.  Neurological:     Mental Status: He is alert and oriented to person, place, and  time.  Psychiatric:        Behavior: Behavior normal.        Thought Content: Thought content normal.        Judgment: Judgment normal.    BP (!) 130/91   Pulse (!) 102   Temp (!) 97.3 F (36.3 C) (Oral)   Ht _0  (1.753 m)   Wt 255 lb (115.7 kg)   BMI 37.66 kg/m      Assessment & Plan:  Michael Roman comes in today with chief complaint of Medical Management of Chronic Issues   Diagnosis and orders addressed:  1. Hyperlipidemia, unspecified hyperlipidemia type Low fat diet - CMP14+EGFR - Lipid panel  2. Generalized anxiety disorder Stress management - buPROPion (WELLBUTRIN XL) 300 MG 24 hr tablet; Take 1 tablet (300 mg total) by mouth daily.  Dispense: 90 tablet;  Refill: 0  3. Depression, major, single episode, moderate ([facility]) - clonazePAM (KLONOPIN) 0.5 MG tablet; Take 0.5 tablets (0.25 mg total) by mouth at bedtime as needed for anxiety.  Dispense: 30 tablet; Refill: 2  4. Morbid obesity with BMI of 40.0-44.9, adult ([place]) Discussed diet and exercise for person with BMI >25 Will recheck weight in 3-6 months    Labs pending Health Maintenance reviewed Diet and exercise encouraged  Follow up plan: 6 months   Mary-Margaret Hassell Done, FNP

## 2018-07-27 NOTE — Patient Instructions (Signed)

## 2018-07-30 ENCOUNTER — Ambulatory Visit: Payer: BLUE CROSS/BLUE SHIELD | Admitting: Nurse Practitioner

## 2018-08-04 IMAGING — US US ABDOMEN COMPLETE
1 series · 13 of 25 positions shown · non-contrast
Comparison: July 11, 2016

CLINICAL DATA: Elevated liver enzymes

EXAM:
ABDOMEN ULTRASOUND COMPLETE

[Series 1: us abdomen complete · 0.25mm/px · 13 of 110 slices shown]
[im 1/110]
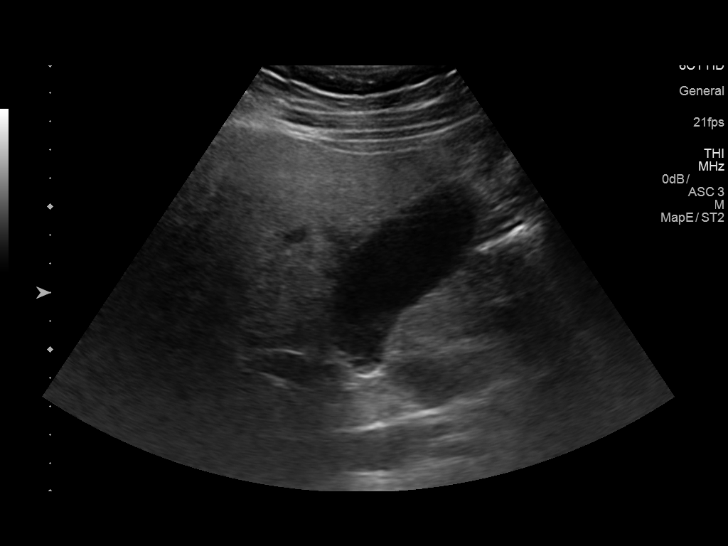
[im 10/110]
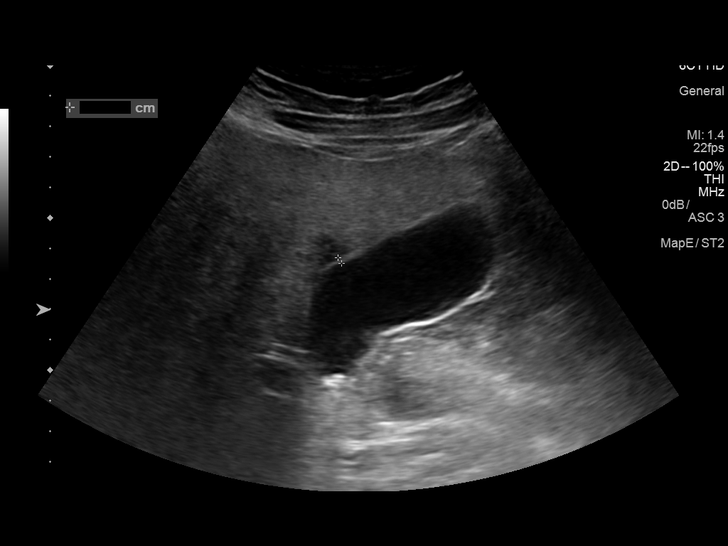
[im 19/110]
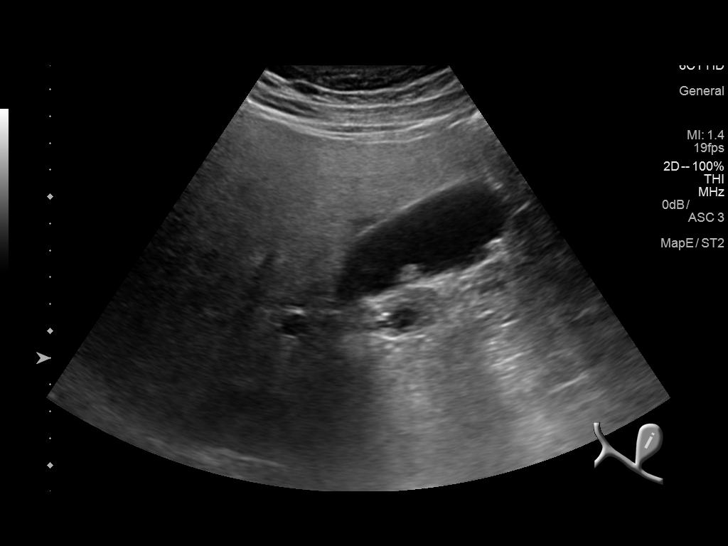
[im 28/110]
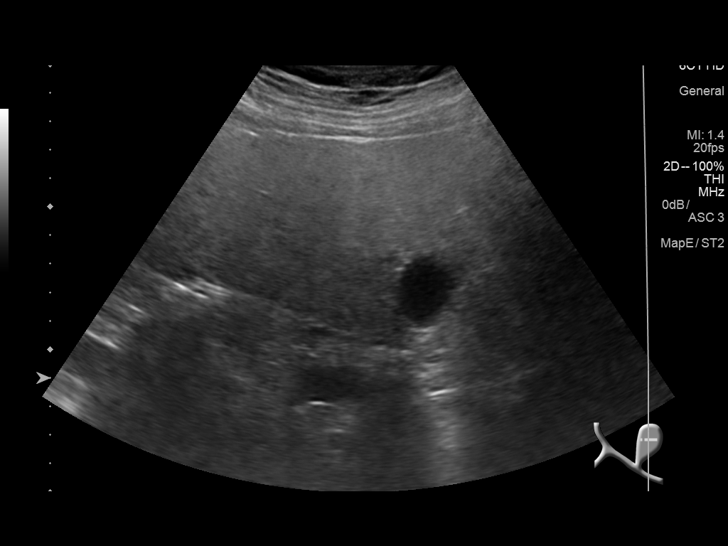
[im 37/110]
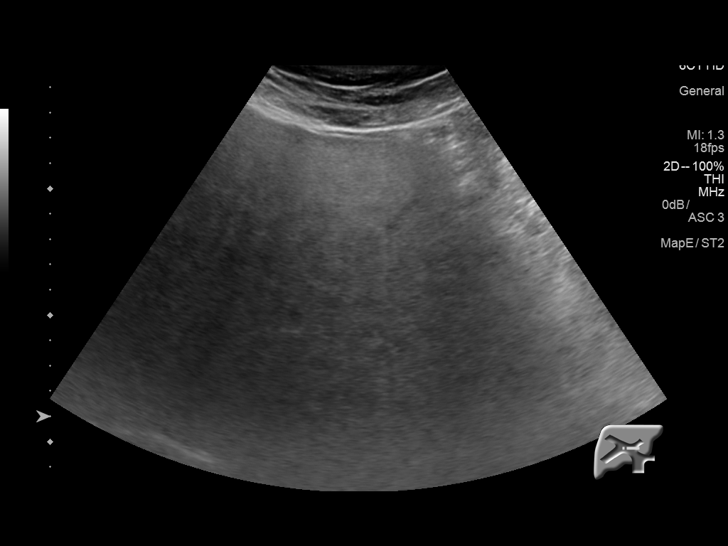
[im 46/110]
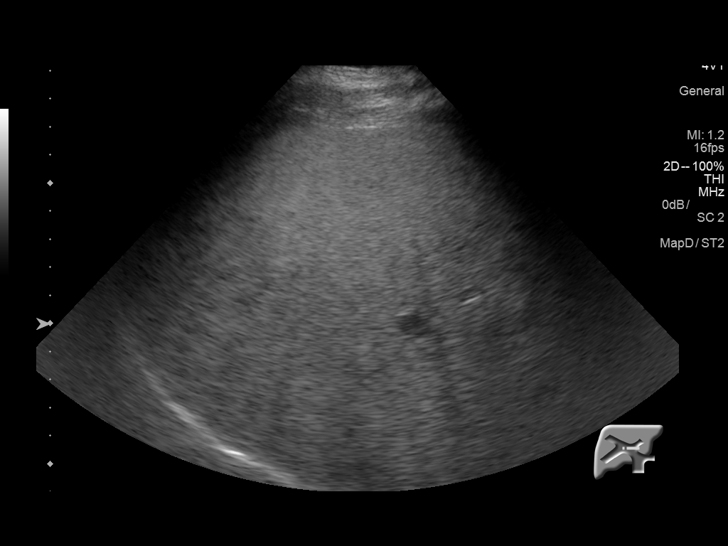
[im 55/110]
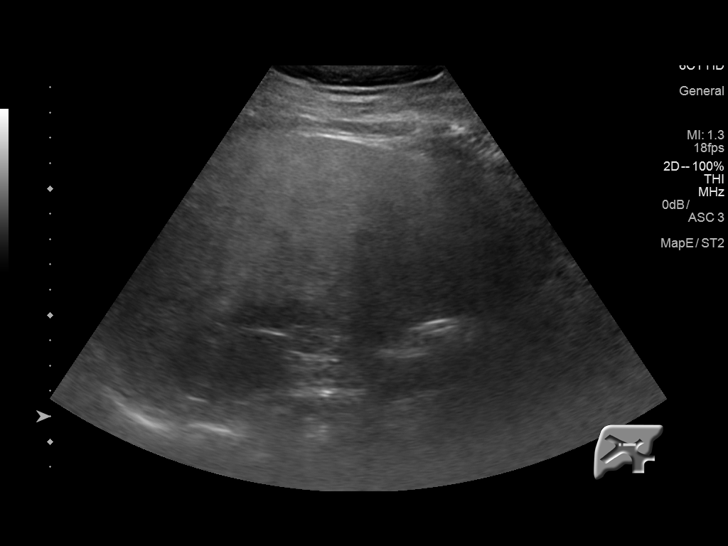
[im 64/110]
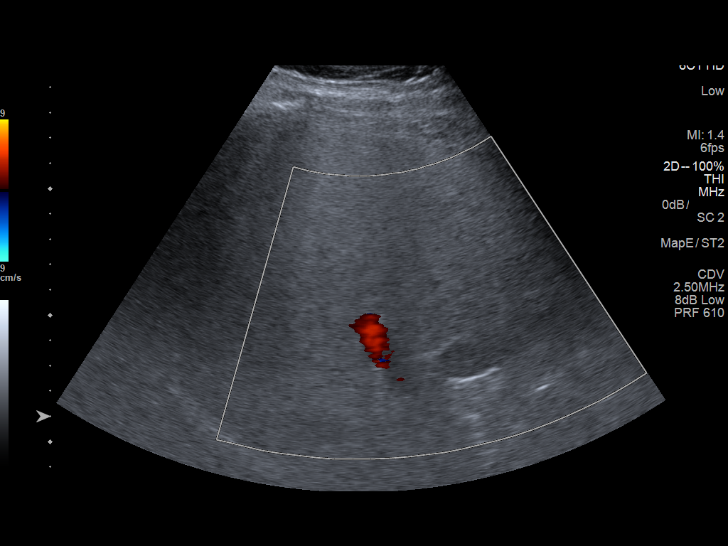
[im 73/110]
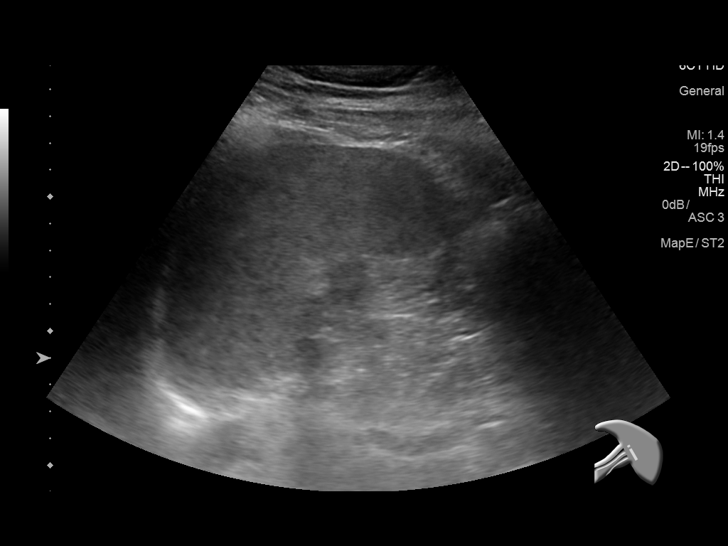
[im 82/110]
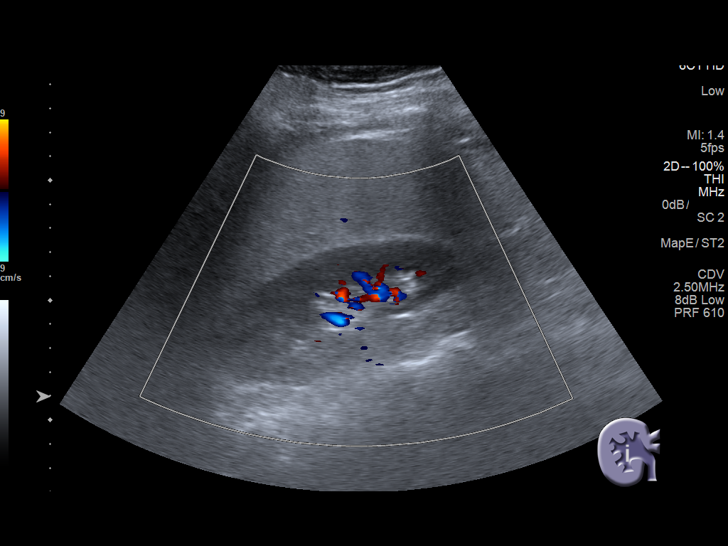
[im 91/110]
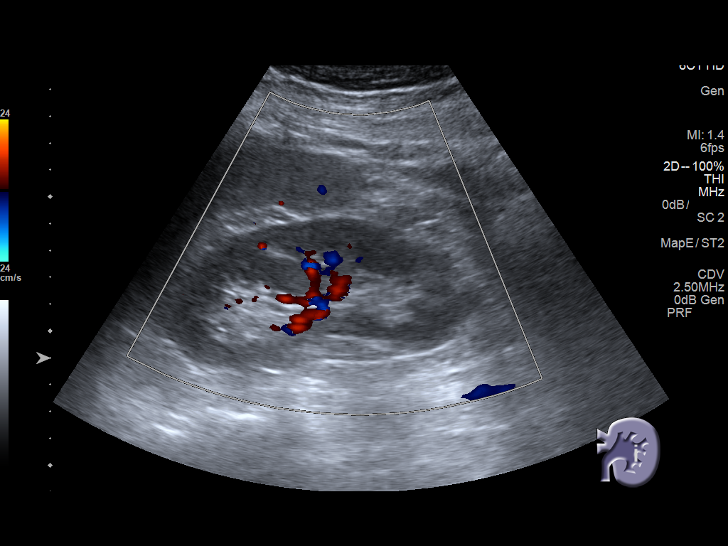
[im 100/110]
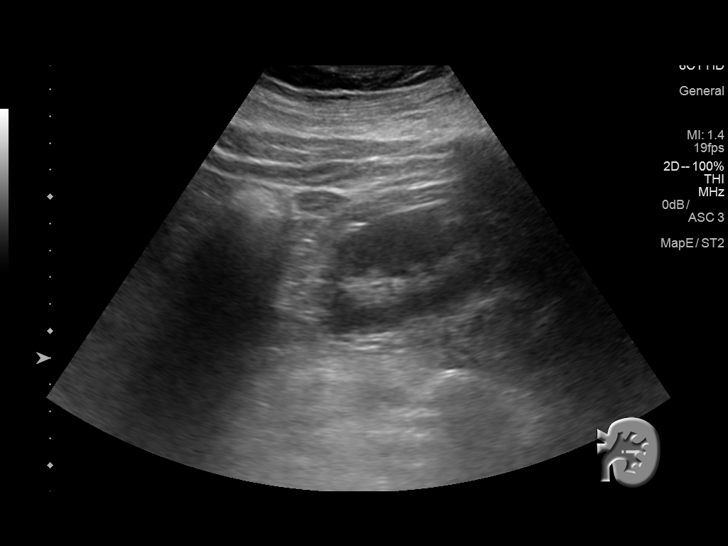
[im 110/110]
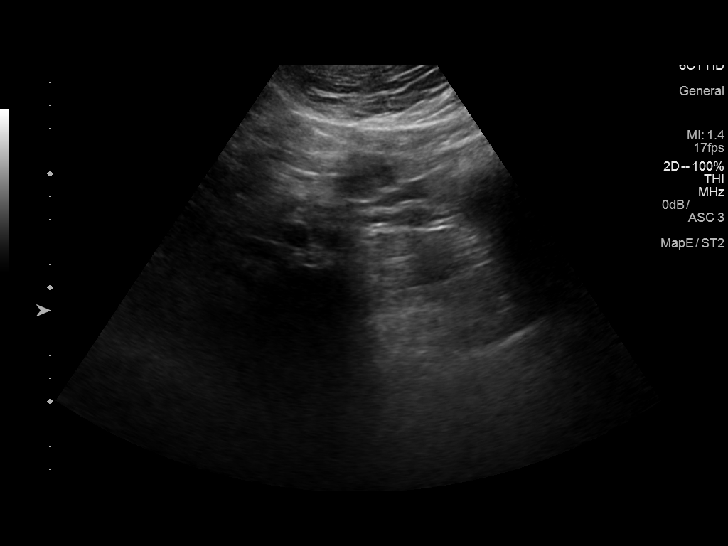

[13 of 25 positions shown; findings below may reference images not displayed]

FINDINGS: Gallbladder: There is again noted a polyp along the wall of the
gallbladder. This polyp currently measures 9 mm, compared to 7 mm on
prior study. Elsewhere, there is a stable 4 mm polyp. There are no
echogenic foci which move and shadow as is expected with gallstones.
There are occasional areas of so-called comet tail artifact
consistent with inflammatory focus along the gallbladder wall such
as cholesterolosis or adenomyomatosis. No gallbladder wall
thickening or pericholecystic fluid is evident. No sonographic
Murphy sign noted by sonographer.

Common bile duct: Diameter: 2 mm. There is no intrahepatic, common
hepatic, or common bile duct dilatation.

Liver: No focal lesion identified. Liver echogenicity is increased
diffusely. Portal vein is patent on color Doppler imaging with
normal direction of blood flow towards the liver.

IVC: No abnormality visualized.

Pancreas: There is no pancreatic mass or inflammatory focus.

Spleen: Size and appearance within normal limits. A small accessory
spleen is also noted.

Right Kidney: Length: 11.6 cm. Echogenicity within normal limits. No
mass or hydronephrosis visualized.

Left Kidney: Length: 11.7 cm. Echogenicity within normal limits. No
mass or hydronephrosis visualized.

Abdominal aorta: No aneurysm visualized.

Other findings: No demonstrable ascites.
IMPRESSION: 1. The larger gallbladder polyp noted previously has increased in
size from 7 mm to 9 mm. This change warrant surgical consultation.
There is also evidence of inflammatory change along portions of the
gallbladder wall, likely adenomyomatosis or cholesterolosis. There
are no echogenic foci in the gallbladder which move and shadow as is
expected with cholelithiasis. There is no gallbladder wall
thickening or pericholecystic fluid.

2. Increased liver echogenicity is again noted, a finding most
likely indicative of hepatic steatosis. While no focal liver lesions
are evident on this study, it must be cautioned that the sensitivity
of ultrasound for detection of focal liver lesions is diminished in
this circumstance.

3.  Study otherwise unremarkable.

These results will be called to the ordering clinician or
representative by the Radiologist Assistant, and communication
documented in the PACS or zVision Dashboard.

## 2018-09-06 ENCOUNTER — Other Ambulatory Visit: Payer: Self-pay

## 2018-09-06 ENCOUNTER — Encounter (HOSPITAL_COMMUNITY): Payer: Self-pay | Admitting: Psychiatry

## 2018-09-06 ENCOUNTER — Ambulatory Visit (INDEPENDENT_AMBULATORY_CARE_PROVIDER_SITE_OTHER): Payer: Self-pay | Admitting: Psychiatry

## 2018-09-06 VITALS — Wt 245.0 lb

## 2018-09-06 DIAGNOSIS — F4010 Social phobia, unspecified: Secondary | ICD-10-CM

## 2018-09-06 DIAGNOSIS — F411 Generalized anxiety disorder: Secondary | ICD-10-CM

## 2018-09-06 MED ORDER — BUPROPION HCL ER (XL) 300 MG PO TB24: 300.0000 mg | ORAL_TABLET | Freq: Every day | ORAL | 0 refills | Status: DC

## 2018-09-06 NOTE — Progress Notes (Signed)
Virtual Visit via Telephone Note  I connected with Michael Roman on 09/06/18 at  4:00 PM EDT by telephone and verified that I am speaking with the correct person using two identifiers.   I discussed the limitations, risks, security and privacy concerns of performing an evaluation and management service by telephone and the availability of in person appointments. I also discussed with the patient that there may be a patient responsible charge related to this service. The patient expressed understanding and agreed to proceed.   History of Present Illness: Patient was evaluated through phone session.  He is doing much better.  On his last visit we increased Wellbutrin to 300 mg and he is tolerating very well.  He admitted increased energy and more motivated.  He also lost 3 pounds since the last visit.  He is sleeping good.  He does not require any clonazepam.  He has been laid off from his work but is not very anxious.  He does go outside some time and does not fear and get anxious when he goes outside.  He is sleeping good.  Denies any tremors, shakes or any EPS.  He is seeing a therapist Energy manager for CBT.  He denies drinking or using any illegal substances.  Since he lost the job he is back with his parents.  He like to continue Wellbutrin XL 300 mg daily.  Past Psychiatric History: Reviewed. No H/O suicidal attempt, psychosis, mania, hallucination or any paranoia. No H/O abuse. Took Zoloft since fifth grade but stopped working.     Observations/Objective: Mental status attention done on the phone.  Patient described his mood good.  His speech is clear, coherent.  His thought process logical and goal-directed.  He denies any auditory or visual hallucination.  He denies any active or passive suicidal thoughts or homicidal thought.  There were no paranoia, delusion or any grandiosity.  He is alert and oriented x3.  His fund of knowledge is good.  His cognition is good.  His insight judgment is  okay.  Assessment and Plan: Social anxiety disorder.  Generalized anxiety disorder.  Panic attacks.  Patient doing better on his Wellbutrin.  He has not taken Klonopin in a while.  He reported no tremors or shakes.  Continue Wellbutrin XL 300 mg daily.  He does not need new prescription of Klonopin.  Encouraged to continue therapy with Isabel Caprice for CBT.  Recommended to call us back if he has any question or any concern.  Discussed medication side effects and benefits.  Follow-up in 3 months.  Follow Up Instructions:    I discussed the assessment and treatment plan with the patient. The patient was provided an opportunity to ask questions and all were answered. The patient agreed with the plan and demonstrated an understanding of the instructions.   The patient was advised to call back or seek an in-person evaluation if the symptoms worsen or if the condition fails to improve as anticipated.  I provided 20 minutes of non-face-to-face time during this encounter.   [name], MD

## 2018-12-08 ENCOUNTER — Encounter (HOSPITAL_COMMUNITY): Payer: Self-pay | Admitting: Psychiatry

## 2018-12-08 ENCOUNTER — Other Ambulatory Visit: Payer: Self-pay

## 2018-12-08 ENCOUNTER — Ambulatory Visit (INDEPENDENT_AMBULATORY_CARE_PROVIDER_SITE_OTHER): Payer: Self-pay | Admitting: Psychiatry

## 2018-12-08 DIAGNOSIS — F411 Generalized anxiety disorder: Secondary | ICD-10-CM

## 2018-12-08 DIAGNOSIS — F321 Major depressive disorder, single episode, moderate: Secondary | ICD-10-CM

## 2018-12-08 DIAGNOSIS — F410 Panic disorder [episodic paroxysmal anxiety] without agoraphobia: Secondary | ICD-10-CM

## 2018-12-08 DIAGNOSIS — F4010 Social phobia, unspecified: Secondary | ICD-10-CM

## 2018-12-08 MED ORDER — BUPROPION HCL ER (XL) 300 MG PO TB24: 300.0000 mg | ORAL_TABLET | Freq: Every day | ORAL | 0 refills | Status: DC

## 2018-12-08 NOTE — Progress Notes (Signed)
Virtual Visit via Telephone Note  I connected with Michael Roman on 12/08/18 at  2:40 PM EDT by telephone and verified that I am speaking with the correct person using two identifiers.   I discussed the limitations, risks, security and privacy concerns of performing an evaluation and management service by telephone and the availability of in person appointments. I also discussed with the patient that there may be a patient responsible charge related to this service. The patient expressed understanding and agreed to proceed.   History of Present Illness: Patient was evaluated through phone session.  He has been taking his medication as prescribed and overall he is feeling much better but is still continuing to have some anxiety when he lives outside.  He is getting treatment and therapy but there are occasions when he feels very nervous.  He is getting therapy from Isabel Caprice for CBT.  He has not taken Klonopin recently because he does not have any major panic attack.  He is sleeping good.  He still looking for a job and so far he has not had any interviews.  He is living with his parents.  He denies drinking or using any illegal substances.  He is active, his energy level is good.  His appetite is okay.  He has no tremors, shakes or any EPS.   Past Psychiatric History:Reviewed. NoH/Osuicidal attempt, psychosis, mania, hallucination or any paranoia. NoH/Oabuse.Took Zoloft since fifth gradebut stopped working.   Psychiatric Specialty Exam: Physical Exam  ROS  There were no vitals taken for this visit.There is no height or weight on file to calculate BMI.  General Appearance: NA  Eye Contact:  NA  Speech:  Clear and Coherent  Volume:  Normal  Mood:  Anxious  Affect:  NA  Thought Process:  Goal Directed  Orientation:  Full (Time, Place, and Person)  Thought Content:  WDL  Suicidal Thoughts:  No  Homicidal Thoughts:  No  Memory:  Immediate;   Good Recent;   Good Remote;    Good  Judgement:  Good  Insight:  Good  Psychomotor Activity:  NA  Concentration:  Concentration: Fair and Attention Span: Fair  Recall:  Good  Fund of Knowledge:  Good  Language:  Good  Akathisia:  No  Handed:  Right  AIMS (if indicated):     Assets:  Communication Skills Desire for Improvement Housing Resilience  ADL's:  Intact  Cognition:  WNL  Sleep:   good      Assessment and Plan: Social anxiety disorder.  Generalized anxiety disorder.  Panic attack.  I encourage to have a discussion with his therapist to discuss about response exposure therapy to help his anxiety when he goes outside to the public.  He does not want to change medication since it is working.  Continue Wellbutrin XL 300 mg daily.  He does not need a new prescription of Klonopin since he has refill remaining however he will call us back if needed.  Recommended to call us back if is any question or any concern.  Follow-up in months.  Follow Up Instructions:    I discussed the assessment and treatment plan with the patient. The patient was provided an opportunity to ask questions and all were answered. The patient agreed with the plan and demonstrated an understanding of the instructions.   The patient was advised to call back or seek an in-person evaluation if the symptoms worsen or if the condition fails to improve as anticipated.  I provided 15  minutes of non-face-to-face time during this encounter.   Michael Nations, MD

## 2018-12-20 ENCOUNTER — Other Ambulatory Visit: Payer: Self-pay

## 2018-12-21 ENCOUNTER — Ambulatory Visit (INDEPENDENT_AMBULATORY_CARE_PROVIDER_SITE_OTHER): Payer: Self-pay | Admitting: Nurse Practitioner

## 2018-12-21 ENCOUNTER — Encounter: Payer: Self-pay | Admitting: Nurse Practitioner

## 2018-12-21 VITALS — BP 140/82 | HR 88 | Temp 98.4°F | Ht 69.0 in | Wt 245.0 lb

## 2018-12-21 DIAGNOSIS — I10 Essential (primary) hypertension: Secondary | ICD-10-CM

## 2018-12-21 MED ORDER — HYDROCHLOROTHIAZIDE 12.5 MG PO CAPS: 12.5000 mg | ORAL_CAPSULE | Freq: Every day | ORAL | 1 refills | Status: DC

## 2018-12-21 NOTE — Patient Instructions (Signed)

## 2018-12-21 NOTE — Progress Notes (Signed)
Subjective:    Patient ID: Michael Roman, male    DOB: 1991/03/23, 28 y.o.   MRN: [id number]   Chief Complaint: Discuss BP   HPI Patient comes in today to discuss blood pressure. 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 He was nervous and thinks that may have raised his blood pressure.    Review of Systems  Constitutional: Negative for activity change and appetite change.  HENT: Negative.   Eyes: Negative for pain.  Respiratory: Negative for shortness of breath.   Cardiovascular: Negative for chest pain, palpitations and leg swelling.  Gastrointestinal: Negative for abdominal pain.  Endocrine: Negative for polydipsia.  Genitourinary: Negative.   Skin: Negative for rash.  Neurological: Negative for dizziness, weakness and headaches.  Hematological: Does not bruise/bleed easily.  Psychiatric/Behavioral: Negative.   All other systems reviewed and are negative.      Objective:   Physical Exam Vitals signs and nursing note reviewed.  Constitutional:      Appearance: Normal appearance. He is well-developed.  HENT:     Head: Normocephalic.     Nose: Nose normal.  Eyes:     Pupils: Pupils are equal, round, and reactive to light.  Neck:     Musculoskeletal: Normal range of motion and neck supple.     Thyroid: No thyroid mass or thyromegaly.     Vascular: No carotid bruit or JVD.     Trachea: Phonation normal.  Cardiovascular:     Rate and Rhythm: Normal rate and regular rhythm.  Pulmonary:     Effort: Pulmonary effort is normal. No respiratory distress.     Breath sounds: Normal breath sounds.  Abdominal:     General: Bowel sounds are normal.     Palpations: Abdomen is soft.     Tenderness: There is no abdominal tenderness.  Musculoskeletal: Normal range of motion.  Lymphadenopathy:     Cervical: No cervical adenopathy.  Skin:    General: Skin is warm  and dry.  Neurological:     Mental Status: He is alert and oriented to person, place, and time.  Psychiatric:        Behavior: Behavior normal.        Thought Content: Thought content normal.        Judgment: Judgment normal.     BP 125/86   Pulse 88   Temp 98.4 F (36.9 C) (Oral)   Ht 5' 9" (1.753 m)   Wt 245 lb (111.1 kg)   BMI 36.18 kg/m       Assessment & Plan:  Michael Roman in today with chief complaint of Discuss BP   1. Essential hypertension Strict low sodium diet - hydrochlorothiazide (MICROZIDE) 12.5 MG capsule; Take 1 capsule (12.5 mg total) by mouth daily.  Dispense: 90 capsule; Refill: 1 - [place], FNP

## 2018-12-22 ENCOUNTER — Telehealth: Payer: Self-pay | Admitting: Nurse Practitioner

## 2018-12-22 ENCOUNTER — Encounter: Payer: Self-pay | Admitting: *Deleted

## 2018-12-22 LAB — CMP14+EGFR
ALT: 58 IU/L — ABNORMAL HIGH (ref 0–44)
AST: 34 IU/L (ref 0–40)
Albumin/Globulin Ratio: 1.6 (ref 1.2–2.2)
Albumin: 4.7 g/dL (ref 4.1–5.2)
Alkaline Phosphatase: 55 IU/L (ref 39–117)
BUN/Creatinine Ratio: 8 — ABNORMAL LOW (ref 9–20)
BUN: 11 mg/dL (ref 6–20)
Bilirubin Total: 0.4 mg/dL (ref 0.0–1.2)
CO2: 26 mmol/L (ref 20–29)
Calcium: 10.2 mg/dL (ref 8.7–10.2)
Chloride: 101 mmol/L (ref 96–106)
Creatinine, Ser: 1.32 mg/dL — ABNORMAL HIGH (ref 0.76–1.27)
GFR calc Af Amer: 85 mL/min/{1.73_m2} (ref >59)
GFR calc non Af Amer: 74 mL/min/{1.73_m2} (ref >59)
Globulin, Total: 2.9 g/dL (ref 1.5–4.5)
Glucose: 102 mg/dL — ABNORMAL HIGH (ref 65–99)
Potassium: 4.8 mmol/L (ref 3.5–5.2)
Sodium: 142 mmol/L (ref 134–144)
Total Protein: 7.6 g/dL (ref 6.0–8.5)

## 2018-12-22 NOTE — Telephone Encounter (Signed)
Pt called - note written with yesterdays BP and meds given.

## 2019-01-27 ENCOUNTER — Other Ambulatory Visit: Payer: Self-pay | Admitting: Nurse Practitioner

## 2019-01-27 DIAGNOSIS — F321 Major depressive disorder, single episode, moderate: Secondary | ICD-10-CM

## 2019-03-10 ENCOUNTER — Encounter (HOSPITAL_COMMUNITY): Payer: Self-pay | Admitting: Psychiatry

## 2019-03-10 ENCOUNTER — Ambulatory Visit (INDEPENDENT_AMBULATORY_CARE_PROVIDER_SITE_OTHER): Payer: Self-pay | Admitting: Psychiatry

## 2019-03-10 ENCOUNTER — Other Ambulatory Visit: Payer: Self-pay

## 2019-03-10 DIAGNOSIS — F4010 Social phobia, unspecified: Secondary | ICD-10-CM

## 2019-03-10 DIAGNOSIS — F411 Generalized anxiety disorder: Secondary | ICD-10-CM

## 2019-03-10 DIAGNOSIS — F410 Panic disorder [episodic paroxysmal anxiety] without agoraphobia: Secondary | ICD-10-CM

## 2019-03-10 MED ORDER — BUPROPION HCL ER (XL) 300 MG PO TB24: 300.0000 mg | ORAL_TABLET | Freq: Every day | ORAL | 0 refills | Status: DC

## 2019-03-10 NOTE — Progress Notes (Signed)
Virtual Visit via Telephone Note  I connected with Michael Roman on 03/10/19 at  2:00 PM EST by telephone and verified that I am speaking with the correct person using two identifiers.   I discussed the limitations, risks, security and privacy concerns of performing an evaluation and management service by telephone and the availability of in person appointments. I also discussed with the patient that there may be a patient responsible charge related to this service. The patient expressed understanding and agreed to proceed.   History of Present Illness: Patient was evaluated through phone session.  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  His anxiety is much better.  He feels the medicine is working and he is able to go outside without any major issues.  He denies any major panic attacks since the last visit.  He has not taken Klonopin in the past few months.  His energy level is good.  He is sleeping good.  He is still living with his parents but hoping to find his own place in the future.  He denies drinking or using any illegal substances.  He has no tremors, shakes or any EPS.  Like to continue Wellbutrin at present dose.  His Klonopin is given by his PCP.  Recently he had blood work.  His creatinine is 1.32.  Past Psychiatric History:Reviewed. H/O anxiety and panic attacks. NoH/Osuicidal attempt, psychosis, mania and hallucination. NoH/Oabuse.Took Zoloft since fifth gradebut stopped working.   Recent Results (from the past 2160 hour(s))  CMP14+EGFR     Status: Abnormal   Collection Time: 12/21/18 10:10 AM  Result Value Ref Range   Glucose 102 (H) 65 - 99 mg/dL   BUN 11 6 - 20 mg/dL   Creatinine, Ser 1.32 (H) 0.76 - 1.27 mg/dL   GFR calc non Af Amer 74 >59 mL/min/1.73   GFR calc Af Amer 85 >59 mL/min/1.73   BUN/Creatinine Ratio 8 (L) 9 - 20   Sodium 142 134 - 144 mmol/L   Potassium 4.8 3.5 - 5.2 mmol/L   Chloride 101 96 - 106 mmol/L   CO2 26  20 - 29 mmol/L   Calcium 10.2 8.7 - 10.2 mg/dL   Total Protein 7.6 6.0 - 8.5 g/dL   Albumin 4.7 4.1 - 5.2 g/dL   Globulin, Total 2.9 1.5 - 4.5 g/dL   Albumin/Globulin Ratio 1.6 1.2 - 2.2   Bilirubin Total 0.4 0.0 - 1.2 mg/dL   Alkaline Phosphatase 55 39 - 117 IU/L   AST 34 0 - 40 IU/L   ALT 58 (H) 0 - 44 IU/L     Psychiatric Specialty Exam: Physical Exam  ROS  There were no vitals taken for this visit.There is no height or weight on file to calculate BMI.  General Appearance: NA  Eye Contact:  NA  Speech:  Clear and Coherent and Normal Rate  Volume:  Normal  Mood:  Euthymic  Affect:  NA  Thought Process:  Goal Directed  Orientation:  Full (Time, Place, and Person)  Thought Content:  Logical  Suicidal Thoughts:  No  Homicidal Thoughts:  No  Memory:  Immediate;   Good Recent;   Good Remote;   Good  Judgement:  Good  Insight:  Good  Psychomotor Activity:  NA  Concentration:  Concentration: Good and Attention Span: Good  Recall:  Good  Fund of Knowledge:  Good  Language:  Good  Akathisia:  No  Handed:  Right  AIMS (if indicated):     Assets:  Communication Skills Desire for Improvement Financial Resources/Insurance Housing Resilience Social Support  ADL's:  Intact  Cognition:  WNL  Sleep:   ok      Assessment and Plan: Social anxiety disorder.  Generalized anxiety disorder.  Panic attack.  Patient is doing well on his medication.  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  He does not feel as anxious when he goes outside to the public place.  He has no tremors, shakes or any EPS.  I will continue Wellbutrin XL 300 g daily.  He is getting Klonopin from his PCP.  Recommended to call us back if is any question or any concern.  Follow-up in 3 months.  Follow Up Instructions:    I discussed the assessment and treatment plan with the patient. The patient was provided an opportunity to ask questions and all were answered. The patient  agreed with the plan and demonstrated an understanding of the instructions.   The patient was advised to call back or seek an in-person evaluation if the symptoms worsen or if the condition fails to improve as anticipated.  I provided 15 minutes of non-face-to-face time during this encounter.   [name], MD

## 2019-03-22 ENCOUNTER — Encounter: Payer: Self-pay | Admitting: Nurse Practitioner

## 2019-03-22 ENCOUNTER — Ambulatory Visit (INDEPENDENT_AMBULATORY_CARE_PROVIDER_SITE_OTHER): Payer: BC Managed Care – PPO | Admitting: Nurse Practitioner

## 2019-03-22 ENCOUNTER — Other Ambulatory Visit: Payer: Self-pay

## 2019-03-22 DIAGNOSIS — F411 Generalized anxiety disorder: Secondary | ICD-10-CM | POA: Diagnosis not present

## 2019-03-22 DIAGNOSIS — I10 Essential (primary) hypertension: Secondary | ICD-10-CM | POA: Diagnosis not present

## 2019-03-22 DIAGNOSIS — F321 Major depressive disorder, single episode, moderate: Secondary | ICD-10-CM

## 2019-03-22 DIAGNOSIS — E785 Hyperlipidemia, unspecified: Secondary | ICD-10-CM

## 2019-03-22 MED ORDER — HYDROCHLOROTHIAZIDE 12.5 MG PO CAPS: 12.5000 mg | ORAL_CAPSULE | Freq: Every day | ORAL | 1 refills | Status: DC

## 2019-03-22 NOTE — Progress Notes (Signed)
Virtual Visit via telephone Note Due to COVID-19 pandemic this visit was conducted virtually. This visit type was conducted due to national recommendations for restrictions regarding the COVID-19 Pandemic (e.g. social distancing, sheltering in place) in an effort to limit this patient's exposure and mitigate transmission in our community. All issues noted in this document were discussed and addressed.  A physical exam was not performed with this format.  I connected with Michael Roman on 03/22/19 at 9:00 by telephone and verified that I am speaking with the correct person using two identifiers. Michael Roman is currently located at home and no one is currently with  hom during visit. The provider, Mary-Margaret Hassell Done, FNP is located in their office at time of visit.  I discussed the limitations, risks, security and privacy concerns of performing an evaluation and management service by telephone and the availability of in person appointments. I also discussed with the patient that there may be a patient responsible charge related to this service. The patient expressed understanding and agreed to proceed.   History and Present Illness:   Chief Complaint: Medical Management of Chronic Issues    HPI: 1. hypertension Does have checked occasionally- running around 130/88. Denies chest pain, sob and headaches.  2. Hyperlipidemia, unspecified hyperlipidemia type Trying to watch diet. Does not do much exercise. Lab Results  Component Value Date   CHOL 154 07/27/2018   HDL 30 (L) 07/27/2018   LDLCALC 91 07/27/2018   TRIG 163 (H) 07/27/2018   CHOLHDL 5.1 (H) 07/27/2018    3. Depression, major, single episode, moderate ([place]) Is on wellbutrin and is having no side effects. Had appointment with psych last week and is going to leave everything the same.  4. 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     Outpatient Encounter Medications as of 03/22/2019  Medication Sig  . buPROPion (WELLBUTRIN XL) 300 MG 24 hr tablet Take 1 tablet (300 mg total) by mouth daily.  . cetirizine (ZYRTEC) 10 MG tablet Take 10 mg by mouth 3 times/day as needed-between meals & bedtime for allergies.  . clonazePAM (KLONOPIN) 0.5 MG tablet TAKE 1 TABLET AT BEDTIME AS NEEDED FOR ANXIETY  . hydrochlorothiazide (MICROZIDE) 12.5 MG capsule Take 1 capsule (12.5 mg total) by mouth daily.    Past Surgical History:  Procedure Laterality Date  . SHOULDER ARTHROSCOPY W/ LABRAL REPAIR     x2  . WISDOM TOOTH EXTRACTION      Family History  Problem Relation Age of Onset  . Hypertension Mother   . Hypertension Father   . Colon cancer Neg Hx   . 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  Negative for diaphoresis and weight loss.  HENT: Negative.   Eyes: Negative for blurred vision, double vision and pain.  Respiratory: Negative.  Negative for shortness of breath.   Cardiovascular: Negative.  Negative for chest pain, palpitations, orthopnea and leg swelling.  Gastrointestinal: Negative for abdominal pain.  Skin: Negative.  Negative for rash.  Neurological: Negative for dizziness, sensory change, loss of consciousness, weakness and headaches.  Endo/Heme/Allergies: Negative for polydipsia. Does not bruise/bleed easily.  Psychiatric/Behavioral: Negative for memory loss. The patient  does not have insomnia.   All other systems reviewed and are negative.    Observations/Objective: Alert and oriented- answers all questions appropriately No distress    Assessment and Plan: Irwin Toran comes in today with chief complaint of Medical Management of Chronic Issues   Diagnosis and orders addressed:  1. Essential  hypertension Low sodium diet - hydrochlorothiazide (MICROZIDE) 12.5 MG capsule; Take 1 capsule (12.5 mg total) by mouth daily.  Dispense: 90 capsule; Refill: 1  2. Hyperlipidemia, unspecified hyperlipidemia type Watch fats in diet  3. Depression, major, single episode, moderate ([place]) Continue wellbutrin Follow up with psych as needed  4. Generalized anxiety disorder Do not use klonopin unless necessary   Previous labs eviewed Health Maintenance reviewed Diet and exercise encouraged  Follow up plan: 6 months      I discussed the assessment and treatment plan with the patient. The patient was provided an opportunity to ask questions and all were answered. The patient agreed with the plan and demonstrated an understanding of the instructions.   The patient was advised to call back or seek an in-person evaluation if the symptoms worsen or if the condition fails to improve as anticipated.  The above assessment and management plan was discussed with the patient. The patient verbalized understanding of and has agreed to the management plan. Patient is aware to call the clinic if symptoms persist or worsen. Patient is aware when to return to the clinic for a follow-up visit. Patient educated on when it is appropriate to go to the emergency department.   Time call ended:  9:20  I provided 20 minutes of non-face-to-face time during this encounter.    Mary-Margaret Hassell Done, FNP

## 2019-06-09 ENCOUNTER — Encounter (HOSPITAL_COMMUNITY): Payer: Self-pay | Admitting: Psychiatry

## 2019-06-09 ENCOUNTER — Other Ambulatory Visit: Payer: Self-pay

## 2019-06-09 ENCOUNTER — Ambulatory Visit (INDEPENDENT_AMBULATORY_CARE_PROVIDER_SITE_OTHER): Payer: Self-pay | Admitting: Psychiatry

## 2019-06-09 DIAGNOSIS — F411 Generalized anxiety disorder: Secondary | ICD-10-CM

## 2019-06-09 DIAGNOSIS — F410 Panic disorder [episodic paroxysmal anxiety] without agoraphobia: Secondary | ICD-10-CM

## 2019-06-09 DIAGNOSIS — F4010 Social phobia, unspecified: Secondary | ICD-10-CM

## 2019-06-09 MED ORDER — BUPROPION HCL ER (XL) 300 MG PO TB24: 300.0000 mg | ORAL_TABLET | Freq: Every day | ORAL | 0 refills | Status: DC

## 2019-06-09 NOTE — Progress Notes (Signed)
Virtual Visit via Telephone Note  I connected with Michael Roman on 06/09/19 at  2:20 PM EST by telephone and verified that I am speaking with the correct person using two identifiers.   I discussed the limitations, risks, security and privacy concerns of performing an evaluation and management service by telephone and the availability of in person appointments. I also discussed with the patient that there may be a patient responsible charge related to this service. The patient expressed understanding and agreed to proceed.   History of Present Illness: Patient was evaluated by phone session.  He is a stable on his current medication.  He reported his job is going very well.  He is working as a Airline pilot.  Denies any major panic attack or any anxiety attack.  He does go outside without any major issues.  He has not taken Klonopin in a while but like to continue Wellbutrin which is helping his depression and anxiety.  His energy level is good.  He is still living with his parents but now actively looking for his own place.  His appetite is okay.  Denies drinking or using any illegal substances.  He has no tremors, shakes or any EPS.  Past Psychiatric History:Reviewed. H/O anxiety and panic attacks. NoH/Osuicidal attempt, psychosis, mania and hallucination. NoH/Oabuse.Took Zoloft since fifth gradebut stopped working.   Psychiatric Specialty Exam: Physical Exam  Review of Systems  There were no vitals taken for this visit.There is no height or weight on file to calculate BMI.  General Appearance: NA  Eye Contact:  NA  Speech:  Clear and Coherent and Normal Rate  Volume:  Normal  Mood:  Euthymic  Affect:  NA  Thought Process:  Goal Directed  Orientation:  Full (Time, Place, and Person)  Thought Content:  WDL  Suicidal Thoughts:  No  Homicidal Thoughts:  No  Memory:  Immediate;   Good Recent;   Good Remote;   Good  Judgement:  Good  Insight:  Good  Psychomotor Activity:  NA   Concentration:  Concentration: Good and Attention Span: Good  Recall:  Good  Fund of Knowledge:  Good  Language:  Good  Akathisia:  No  Handed:  Right  AIMS (if indicated):     Assets:  Communication Skills Desire for Improvement Housing Resilience Social Support Talents/Skills Transportation  ADL's:  Intact  Cognition:  WNL  Sleep:   6-7 hrs      Assessment and Plan: Social anxiety disorder.  Panic attacks.  Generalized anxiety disorder.  Patient is a stable on his current medication.  Continue Wellbutrin XL 300 mg daily.  He is getting Klonopin from his PCP however he has not used in a while.  Discussed medication side effects and benefits.  Recommended to call us back if is any question or any concern.  Follow-up in 3 months.  Follow Up Instructions:    I discussed the assessment and treatment plan with the patient. The patient was provided an opportunity to ask questions and all were answered. The patient agreed with the plan and demonstrated an understanding of the instructions.   The patient was advised to call back or seek an in-person evaluation if the symptoms worsen or if the condition fails to improve as anticipated.  I provided 15 minutes of non-face-to-face time during this encounter.   [name], MD

## 2019-09-07 ENCOUNTER — Ambulatory Visit (HOSPITAL_COMMUNITY): Payer: Self-pay | Admitting: Psychiatry

## 2019-10-12 ENCOUNTER — Other Ambulatory Visit: Payer: Self-pay

## 2019-10-12 ENCOUNTER — Ambulatory Visit (HOSPITAL_COMMUNITY): Payer: Self-pay | Admitting: Psychiatry

## 2019-10-13 ENCOUNTER — Encounter (HOSPITAL_COMMUNITY): Payer: Self-pay | Admitting: Psychiatry

## 2019-10-13 ENCOUNTER — Telehealth (INDEPENDENT_AMBULATORY_CARE_PROVIDER_SITE_OTHER): Payer: Self-pay | Admitting: Psychiatry

## 2019-10-13 ENCOUNTER — Other Ambulatory Visit: Payer: Self-pay

## 2019-10-13 DIAGNOSIS — F4010 Social phobia, unspecified: Secondary | ICD-10-CM

## 2019-10-13 DIAGNOSIS — F411 Generalized anxiety disorder: Secondary | ICD-10-CM

## 2019-10-13 MED ORDER — BUPROPION HCL ER (XL) 300 MG PO TB24: 300.0000 mg | ORAL_TABLET | Freq: Every day | ORAL | 0 refills | Status: DC

## 2019-10-13 NOTE — Progress Notes (Signed)
Virtual Visit via Telephone Note  I connected with Michael Roman on 10/13/19 at  9:20 AM EDT by telephone and verified that I am speaking with the correct person using two identifiers.   I discussed the limitations, risks, security and privacy concerns of performing an evaluation and management service by telephone and the availability of in person appointments. I also discussed with the patient that there may be a patient responsible charge related to this service. The patient expressed understanding and agreed to proceed.  Patient location; home Provider location; home office  History of Present Illness: Patient is evaluated by phone session.  He is taking his medication and doing well.  He started going outside and does not feel as anxious nervous.  He has not taken Klonopin in a while because he do not recall any major panic attack.  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  He denies any crying spells or any feeling of hopelessness or worthlessness.  Since he is going out and started exercise he had lost weight and is happy about it.  His energy level is good.  He is sleeping good.  He denies any tremors shakes or any EPS.  He like to continue this medication.  Denies drinking or using any illegal substances.   Past Psychiatric History:Reviewed. H/O anxiety and panic attacks.NoH/Osuicidal attempt, psychosis, maniaandhallucination.NoH/Oabuse.Took Zoloft since fifth gradebut stopped working.   Psychiatric Specialty Exam: Physical Exam  Review of Systems  Weight 220 lb (99.8 kg).There is no height or weight on file to calculate BMI.  General Appearance: NA  Eye Contact:  NA  Speech:  Clear and Coherent and Normal Rate  Volume:  Normal  Mood:  Euthymic  Affect:  NA  Thought Process:  Coherent  Orientation:  Full (Time, Place, and Person)  Thought Content:  Logical  Suicidal Thoughts:  No  Homicidal Thoughts:   No  Memory:  Immediate;   Good Recent;   Good Remote;   Good  Judgement:  Good  Insight:  Good  Psychomotor Activity:  NA  Concentration:  Concentration: Good and Attention Span: Good  Recall:  Good  Fund of Knowledge:  Good  Language:  Good  Akathisia:  No  Handed:  Right  AIMS (if indicated):     Assets:  Communication Skills Desire for Improvement Housing Resilience Social Support Talents/Skills Transportation  ADL's:  Intact  Cognition:  WNL  Sleep:   good      Assessment and Plan: Generalized anxiety disorder.  Social anxiety disorder.  Patient is a stable on his current medication.  Continue Wellbutrin XL 300 mg daily.  Continue regular exercise and watching his caloric intake.  Discussed healthy lifestyle.  He has not taken the Klonopin in a while which is given by his PCP.  Recommended to call us back if is any question or any concern.  Follow-up in 3 months.  Follow Up Instructions:    I discussed the assessment and treatment plan with the patient. The patient was provided an opportunity to ask questions and all were answered. The patient agreed with the plan and demonstrated an understanding of the instructions.   The patient was advised to call back or seek an in-person evaluation if the symptoms worsen or if the condition fails to improve as anticipated.  I provided 15 minutes of non-face-to-face time during this encounter.   [name], MD

## 2019-12-14 DIAGNOSIS — Z6832 Body mass index (BMI) 32.0-32.9, adult: Secondary | ICD-10-CM | POA: Diagnosis not present

## 2019-12-14 DIAGNOSIS — R21 Rash and other nonspecific skin eruption: Secondary | ICD-10-CM | POA: Diagnosis not present

## 2019-12-22 ENCOUNTER — Encounter: Payer: Self-pay | Admitting: Nurse Practitioner

## 2019-12-22 ENCOUNTER — Other Ambulatory Visit: Payer: Self-pay

## 2019-12-22 ENCOUNTER — Ambulatory Visit: Payer: BLUE CROSS/BLUE SHIELD | Admitting: Nurse Practitioner

## 2019-12-22 VITALS — BP 136/97 | HR 89 | Temp 98.2°F | Resp 20 | Ht 69.0 in | Wt 228.0 lb

## 2019-12-22 DIAGNOSIS — I10 Essential (primary) hypertension: Secondary | ICD-10-CM | POA: Diagnosis not present

## 2019-12-22 DIAGNOSIS — Z6841 Body Mass Index (BMI) 40.0 and over, adult: Secondary | ICD-10-CM

## 2019-12-22 DIAGNOSIS — F321 Major depressive disorder, single episode, moderate: Secondary | ICD-10-CM

## 2019-12-22 DIAGNOSIS — F411 Generalized anxiety disorder: Secondary | ICD-10-CM

## 2019-12-22 DIAGNOSIS — E785 Hyperlipidemia, unspecified: Secondary | ICD-10-CM | POA: Diagnosis not present

## 2019-12-22 DIAGNOSIS — F4010 Social phobia, unspecified: Secondary | ICD-10-CM

## 2019-12-22 MED ORDER — BUPROPION HCL ER (XL) 300 MG PO TB24: 300.0000 mg | ORAL_TABLET | Freq: Every day | ORAL | 0 refills | Status: DC

## 2019-12-22 MED ORDER — HYDROCHLOROTHIAZIDE 12.5 MG PO CAPS: 12.5000 mg | ORAL_CAPSULE | Freq: Every day | ORAL | 1 refills | Status: DC

## 2019-12-22 NOTE — Patient Instructions (Signed)
DASH Eating Plan DASH stands for "Dietary Approaches to Stop Hypertension." The DASH eating plan is a healthy eating plan that has been shown to reduce high blood pressure (hypertension). It may also reduce your risk for type 2 diabetes, heart disease, and stroke. The DASH eating plan may also help with weight loss. What are tips for following this plan?  General guidelines  Avoid eating more than 2,300 mg (milligrams) of salt (sodium) a day. If you have hypertension, you may need to reduce your sodium intake to 1,500 mg a day.  Limit alcohol intake to no more than 1 drink a day for nonpregnant women and 2 drinks a day for men. One drink equals 12 oz of beer, 5 oz of wine, or 1 oz of hard liquor.  Work with your health care provider to maintain a healthy body weight or to lose weight. Ask what an ideal weight is for you.  Get at least 30 minutes of exercise that causes your heart to beat faster (aerobic exercise) most days of the week. Activities may include walking, swimming, or biking.  Work with your health care provider or diet and nutrition specialist (dietitian) to adjust your eating plan to your individual calorie needs. Reading food labels   Check food labels for the amount of sodium per serving. Choose foods with less than 5 percent of the Daily Value of sodium. Generally, foods with less than 300 mg of sodium per serving fit into this eating plan.  To find whole grains, look for the word "whole" as the first word in the ingredient list. Shopping  Buy products labeled as "low-sodium" or "no salt added."  Buy fresh foods. Avoid canned foods and premade or frozen meals. Cooking  Avoid adding salt when cooking. Use salt-free seasonings or herbs instead of table salt or sea salt. Check with your health care provider or pharmacist before using salt substitutes.  Do not fry foods. Cook foods using healthy methods such as baking, boiling, grilling, and broiling instead.  Cook with  heart-healthy oils, such as olive, canola, soybean, or sunflower oil. Meal planning  Eat a balanced diet that includes: ? 5 or more servings of fruits and vegetables each day. At each meal, try to fill half of your plate with fruits and vegetables. ? Up to 6-8 servings of whole grains each day. ? Less than 6 oz of lean meat, poultry, or fish each day. A 3-oz serving of meat is about the same size as a deck of cards. One egg equals 1 oz. ? 2 servings of low-fat dairy each day. ? A serving of nuts, seeds, or beans 5 times each week. ? Heart-healthy fats. Healthy fats called Omega-3 fatty acids are found in foods such as flaxseeds and coldwater fish, like sardines, salmon, and mackerel.  Limit how much you eat of the following: ? Canned or prepackaged foods. ? Food that is high in trans fat, such as fried foods. ? Food that is high in saturated fat, such as fatty meat. ? Sweets, desserts, sugary drinks, and other foods with added sugar. ? Full-fat dairy products.  Do not salt foods before eating.  Try to eat at least 2 vegetarian meals each week.  Eat more home-cooked food and less restaurant, buffet, and fast food.  When eating at a restaurant, ask that your food be prepared with less salt or no salt, if possible. What foods are recommended? The items listed may not be a complete list. Talk with your dietitian about   what dietary choices are best for you. Grains Whole-grain or whole-wheat bread. Whole-grain or whole-wheat pasta. Brown rice. Oatmeal. Quinoa. Bulgur. Whole-grain and low-sodium cereals. Pita bread. Low-fat, low-sodium crackers. Whole-wheat flour tortillas. Vegetables Fresh or frozen vegetables (raw, steamed, roasted, or grilled). Low-sodium or reduced-sodium tomato and vegetable juice. Low-sodium or reduced-sodium tomato sauce and tomato paste. Low-sodium or reduced-sodium canned vegetables. Fruits All fresh, dried, or frozen fruit. Canned fruit in natural juice (without  added sugar). Meat and other protein foods Skinless chicken or turkey. Ground chicken or turkey. Pork with fat trimmed off. Fish and seafood. Egg whites. Dried beans, peas, or lentils. Unsalted nuts, nut butters, and seeds. Unsalted canned beans. Lean cuts of beef with fat trimmed off. Low-sodium, lean deli meat. Dairy Low-fat (1%) or fat-free (skim) milk. Fat-free, low-fat, or reduced-fat cheeses. Nonfat, low-sodium ricotta or cottage cheese. Low-fat or nonfat yogurt. Low-fat, low-sodium cheese. Fats and oils Soft margarine without trans fats. Vegetable oil. Low-fat, reduced-fat, or light mayonnaise and salad dressings (reduced-sodium). Canola, safflower, olive, soybean, and sunflower oils. Avocado. Seasoning and other foods Herbs. Spices. Seasoning mixes without salt. Unsalted popcorn and pretzels. Fat-free sweets. What foods are not recommended? The items listed may not be a complete list. Talk with your dietitian about what dietary choices are best for you. Grains Baked goods made with fat, such as croissants, muffins, or some breads. Dry pasta or rice meal packs. Vegetables Creamed or fried vegetables. Vegetables in a cheese sauce. Regular canned vegetables (not low-sodium or reduced-sodium). Regular canned tomato sauce and paste (not low-sodium or reduced-sodium). Regular tomato and vegetable juice (not low-sodium or reduced-sodium). Pickles. Olives. Fruits Canned fruit in a light or heavy syrup. Fried fruit. Fruit in cream or butter sauce. Meat and other protein foods Fatty cuts of meat. Ribs. Fried meat. Bacon. Sausage. Bologna and other processed lunch meats. Salami. Fatback. Hotdogs. Bratwurst. Salted nuts and seeds. Canned beans with added salt. Canned or smoked fish. Whole eggs or egg yolks. Chicken or turkey with skin. Dairy Whole or 2% milk, cream, and half-and-half. Whole or full-fat cream cheese. Whole-fat or sweetened yogurt. Full-fat cheese. Nondairy creamers. Whipped toppings.  Processed cheese and cheese spreads. Fats and oils Butter. Stick margarine. Lard. Shortening. Ghee. Bacon fat. Tropical oils, such as coconut, palm kernel, or palm oil. Seasoning and other foods Salted popcorn and pretzels. Onion salt, garlic salt, seasoned salt, table salt, and sea salt. Worcestershire sauce. Tartar sauce. Barbecue sauce. Teriyaki sauce. Soy sauce, including reduced-sodium. Steak sauce. Canned and packaged gravies. Fish sauce. Oyster sauce. Cocktail sauce. Horseradish that you find on the shelf. Ketchup. Mustard. Meat flavorings and tenderizers. Bouillon cubes. Hot sauce and Tabasco sauce. Premade or packaged marinades. Premade or packaged taco seasonings. Relishes. Regular salad dressings. Where to find more information:  National Heart, Lung, and Blood Institute: www.nhlbi.nih.gov  American Heart Association: www.heart.org Summary  The DASH eating plan is a healthy eating plan that has been shown to reduce high blood pressure (hypertension). It may also reduce your risk for type 2 diabetes, heart disease, and stroke.  With the DASH eating plan, you should limit salt (sodium) intake to 2,300 mg a day. If you have hypertension, you may need to reduce your sodium intake to 1,500 mg a day.  When on the DASH eating plan, aim to eat more fresh fruits and vegetables, whole grains, lean proteins, low-fat dairy, and heart-healthy fats.  Work with your health care provider or diet and nutrition specialist (dietitian) to adjust your eating plan to your   individual calorie needs. This information is not intended to replace advice given to you by your health care provider. Make sure you discuss any questions you have with your health care provider. Document Revised: 03/27/2017 Document Reviewed: 04/07/2016 Elsevier Patient Education  2020 [employer]  

## 2019-12-22 NOTE — Progress Notes (Signed)
Subjective:    Patient ID: Michael Roman, male    DOB: 1990-09-14, 29 y.o.   MRN: [id number]   Chief Complaint: Medical Management of Chronic Issues    HPI:  1. Essential hypertension Checks BP at work sometimes and has been about the same as today. Does try to watch sodium intake but has been eating a lot of processed foods lately. No problems with HCTZ and has been taking regularly. No c/o chest pain, SOB, headaches. BP Readings from Last 3 Encounters:  12/22/19 (!) 136/97  12/21/18 140/82  07/27/18 (!) 130/91     2. Morbid obesity with BMI of 40.0-44.9, adult ([place]) Has lost 17 pounds since last visit. Believes wellbutrin has helped decrease his appetite and has helped his sleep schedule. BMI Readings from Last 3 Encounters:  12/22/19 33.67 kg/m  12/21/18 36.18 kg/m  07/27/18 37.66 kg/m   Wt Readings from Last 3 Encounters:  12/22/19 228 lb (103.4 kg)  12/21/18 245 lb (111.1 kg)  07/27/18 255 lb (115.7 kg)     3. Hyperlipidemia, unspecified hyperlipidemia type Has been trying to watch fat intake in diet. Lab Results  Component Value Date   CHOL 154 07/27/2018   HDL 30 (L) 07/27/2018   LDLCALC 91 07/27/2018   TRIG 163 (H) 07/27/2018   CHOLHDL 5.1 (H) 07/27/2018     4. Depression, major, single episode, moderate ([place]) Has been taking wellbutrin and it is helping his depression.   Office Visit from 12/22/2019 in [facility]  PHQ-2 Total Score 0       5. Generalized anxiety disorder No issues with anxiety at this time. Has not had to take klonopin for about a year. GAD 7 : Generalized Anxiety Score 12/22/2019 03/22/2019 10/04/2015  Nervous, Anxious, on Edge 0 1 0  Control/stop worrying 0 1 0  Worry too much - different things 0 1 0  Trouble relaxing 0 0 0  Restless 0 0 0  Easily annoyed or irritable 0 0 0  Afraid - awful might happen 0 0 0  Total GAD 7 Score 0 3 0  Anxiety Difficulty Not difficult at all Not difficult at all -         Outpatient Encounter Medications as of 12/22/2019  Medication Sig   buPROPion (WELLBUTRIN XL) 300 MG 24 hr tablet Take 1 tablet (300 mg total) by mouth daily.   cetirizine (ZYRTEC) 10 MG tablet Take 10 mg by mouth 3 times/day as needed-between meals & bedtime for allergies.   hydrochlorothiazide (MICROZIDE) 12.5 MG capsule Take 1 capsule (12.5 mg total) by mouth daily.   clonazePAM (KLONOPIN) 0.5 MG tablet TAKE 1 TABLET AT BEDTIME AS NEEDED FOR ANXIETY (Patient not taking: Reported on 10/13/2019)   No facility-administered encounter medications on file as of 12/22/2019.    Past Surgical History:  Procedure Laterality Date   SHOULDER ARTHROSCOPY W/ LABRAL REPAIR     x2   WISDOM TOOTH EXTRACTION      Family History  Problem Relation Age of Onset   Hypertension Mother    Hypertension Father    Colon cancer Neg Hx    Stomach cancer Neg Hx     New complaints: None.  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  Controlled substance contract: n/a   Review of Systems  Constitutional: Positive for appetite change (decreased with wellbutrin).  HENT: Negative.   Eyes: Negative.   Respiratory: Negative.   Cardiovascular: Negative.   Gastrointestinal: Negative.  Endocrine: Negative.   Genitourinary: Negative.   Musculoskeletal: Negative.   Skin: Negative.   Neurological: Negative.   Psychiatric/Behavioral: Negative.       Objective:   Physical Exam Vitals reviewed.  Constitutional:      Appearance: Normal appearance.  HENT:     Head: Normocephalic.     Right Ear: Tympanic membrane normal.     Left Ear: Tympanic membrane normal.     Nose: Nose normal.     Mouth/Throat:     Mouth: Mucous membranes are moist.     Pharynx: Oropharynx is clear.  Eyes:     Conjunctiva/sclera: Conjunctivae normal.     Pupils: Pupils are equal, round, and reactive to light.  Cardiovascular:     Rate and Rhythm: Normal rate and regular rhythm.      Pulses: Normal pulses.     Heart sounds: Normal heart sounds.  Pulmonary:     Effort: Pulmonary effort is normal.     Breath sounds: Normal breath sounds.  Abdominal:     General: Abdomen is flat. Bowel sounds are normal.     Palpations: Abdomen is soft.  Musculoskeletal:        General: Normal range of motion.     Cervical back: Normal range of motion.  Skin:    General: Skin is warm and dry.  Neurological:     Mental Status: He is alert and oriented to person, place, and time.  Psychiatric:        Mood and Affect: Mood normal.        Behavior: Behavior normal.   BP (!) 136/97    Pulse 89    Temp 98.2 F (36.8 C) (Temporal)    Resp 20    Ht 5' 9"  (1.753 m)    Wt 228 lb (103.4 kg)    SpO2 98%    BMI 33.67 kg/m      Assessment & Plan:  Mykai Wendorf comes in today with chief complaint of Medical Management of Chronic Issues   Diagnosis and orders addressed:  1. Essential hypertension Low salt diet.  2. Morbid obesity with BMI of 40.0-44.9, adult ([place]) Discussed diet and exercise for person with BMI >25 Will recheck weight in 3-6 months   3. Hyperlipidemia, unspecified hyperlipidemia type Watch fat intake in diet.  4. Depression, major, single episode, moderate ([facility]) Contine wellbutrin.  5. Generalized anxiety disorder Stress management   Labs pending Health Maintenance reviewed Diet and exercise encouraged  Follow up plan: 6 months   Mary-Margaret Hassell Done, FNP

## 2019-12-23 LAB — CMP14+EGFR
ALT: 25 IU/L (ref 0–44)
AST: 22 IU/L (ref 0–40)
Albumin/Globulin Ratio: 1.8 (ref 1.2–2.2)
Albumin: 4.8 g/dL (ref 4.1–5.2)
Alkaline Phosphatase: 42 IU/L — ABNORMAL LOW (ref 48–121)
BUN/Creatinine Ratio: 14 (ref 9–20)
BUN: 16 mg/dL (ref 6–20)
Bilirubin Total: 0.5 mg/dL (ref 0.0–1.2)
CO2: 26 mmol/L (ref 20–29)
Calcium: 9.9 mg/dL (ref 8.7–10.2)
Chloride: 105 mmol/L (ref 96–106)
Creatinine, Ser: 1.11 mg/dL (ref 0.76–1.27)
GFR calc Af Amer: 105 mL/min/{1.73_m2} (ref >59)
GFR calc non Af Amer: 91 mL/min/{1.73_m2} (ref >59)
Globulin, Total: 2.6 g/dL (ref 1.5–4.5)
Glucose: 97 mg/dL (ref 65–99)
Potassium: 5 mmol/L (ref 3.5–5.2)
Sodium: 141 mmol/L (ref 134–144)
Total Protein: 7.4 g/dL (ref 6.0–8.5)

## 2019-12-23 LAB — CBC WITH DIFFERENTIAL/PLATELET
Basophils Absolute: 0.1 10*3/uL (ref 0.0–0.2)
Basos: 1 %
EOS (ABSOLUTE): 0.6 10*3/uL — ABNORMAL HIGH (ref 0.0–0.4)
Eos: 8 %
Hematocrit: 47.4 % (ref 37.5–51.0)
Hemoglobin: 16.2 g/dL (ref 13.0–17.7)
Immature Grans (Abs): 0 10*3/uL (ref 0.0–0.1)
Immature Granulocytes: 1 %
Lymphocytes Absolute: 3.1 10*3/uL (ref 0.7–3.1)
Lymphs: 38 %
MCH: 29.9 pg (ref 26.6–33.0)
MCHC: 34.2 g/dL (ref 31.5–35.7)
MCV: 88 fL (ref 79–97)
Monocytes Absolute: 0.5 10*3/uL (ref 0.1–0.9)
Monocytes: 6 %
Neutrophils Absolute: 3.9 10*3/uL (ref 1.4–7.0)
Neutrophils: 46 %
Platelets: 314 10*3/uL (ref 150–450)
RBC: 5.41 x10E6/uL (ref 4.14–5.80)
RDW: 13.1 % (ref 11.6–15.4)
WBC: 8.1 10*3/uL (ref 3.4–10.8)

## 2019-12-23 LAB — LIPID PANEL
Chol/HDL Ratio: 4.4 ratio (ref 0.0–5.0)
Cholesterol, Total: 153 mg/dL (ref 100–199)
HDL: 35 mg/dL — ABNORMAL LOW (ref >39)
LDL Chol Calc (NIH): 90 mg/dL (ref 0–99)
Triglycerides: 162 mg/dL — ABNORMAL HIGH (ref 0–149)
VLDL Cholesterol Cal: 28 mg/dL (ref 5–40)

## 2020-01-13 ENCOUNTER — Telehealth (INDEPENDENT_AMBULATORY_CARE_PROVIDER_SITE_OTHER): Payer: BLUE CROSS/BLUE SHIELD | Admitting: Psychiatry

## 2020-01-13 ENCOUNTER — Other Ambulatory Visit: Payer: Self-pay

## 2020-01-13 ENCOUNTER — Encounter (HOSPITAL_COMMUNITY): Payer: Self-pay | Admitting: Psychiatry

## 2020-01-13 DIAGNOSIS — F411 Generalized anxiety disorder: Secondary | ICD-10-CM

## 2020-01-13 DIAGNOSIS — F4010 Social phobia, unspecified: Secondary | ICD-10-CM

## 2020-01-13 MED ORDER — BUPROPION HCL ER (XL) 300 MG PO TB24: 300.0000 mg | ORAL_TABLET | Freq: Every day | ORAL | 0 refills | Status: DC

## 2020-01-13 NOTE — Progress Notes (Signed)
Virtual Visit via Telephone Note  I connected with Michael Roman on 01/13/20 at  8:40 AM EDT by telephone and verified that I am speaking with the correct person using two identifiers.  Location: Patient: home Provider: home office   I discussed the limitations, risks, security and privacy concerns of performing an evaluation and management service by telephone and the availability of in person appointments. I also discussed with the patient that there may be a patient responsible charge related to this service. The patient expressed understanding and agreed to proceed.   History of Present Illness: Patient is evaluated by phone session.  He is doing better on Wellbutrin XL 300 mg daily.  He is still struggle with anxiety especially in public places but he is working on his triggers and so far things are going well.  In the summer he had a hiking and he enjoyed.  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  He is liv he is living with his parents.  Recently he has a visit to his PCP and he had a blood work.  His CBC, basic chemistry is normal.  Patient has no concern from the medication.  Elect to keep his current medication.  Past Psychiatric History: H/O anxiety and panic attacks.NoH/Osuicidal attempt, psychosis, maniaandhallucination.NoH/[place].Took Zoloft since fifth gradebut stopped working.  Recent Results (from the past 2160 hour(s))  CBC with Differential/Platelet     Status: Abnormal   Collection Time: 12/22/19  9:24 AM  Result Value Ref Range   WBC 8.1 3.4 - 10.8 x10E3/uL   RBC 5.41 4.14 - 5.80 x10E6/uL   Hemoglobin 16.2 13.0 - 17.7 g/dL   Hematocrit 47.4 37.5 - 51.0 %   MCV 88 79 - 97 fL   MCH 29.9 26.6 - 33.0 pg   MCHC 34.2 31 - 35 g/dL   RDW 13.1 11.6 - 15.4 %   Platelets 314 150 - 450 x10E3/uL   Neutrophils 46 Not Estab. %   Lymphs 38 Not Estab. %   Monocytes 6 Not Estab. %   Eos 8 Not Estab. %   Basos 1 Not Estab. %    Neutrophils Absolute 3.9 1 - 7 x10E3/uL   Lymphocytes Absolute 3.1 0 - 3 x10E3/uL   Monocytes Absolute 0.5 0 - 0 x10E3/uL   EOS (ABSOLUTE) 0.6 (H) 0.0 - 0.4 x10E3/uL   Basophils Absolute 0.1 0 - 0 x10E3/uL   Immature Granulocytes 1 Not Estab. %   Immature Grans (Abs) 0.0 0.0 - 0.1 x10E3/uL  CMP14+EGFR     Status: Abnormal   Collection Time: 12/22/19  9:24 AM  Result Value Ref Range   Glucose 97 65 - 99 mg/dL   BUN 16 6 - 20 mg/dL   Creatinine, Ser 1.11 0.76 - 1.27 mg/dL   GFR calc non Af Amer 91 >59 mL/min/1.73   GFR calc Af Amer 105 >59 mL/min/1.73    Comment: **[facility] currently reports eGFR in compliance with the current**   recommendations of the [employer]. [facility] will   update reporting as new guidelines are published from the NKF-ASN   Task force.    BUN/Creatinine Ratio 14 9 - 20   Sodium 141 134 - 144 mmol/L   Potassium 5.0 3.5 - 5.2 mmol/L   Chloride 105 96 - 106 mmol/L   CO2 26 20 - 29 mmol/L   Calcium 9.9 8.7 - 10.2 mg/dL   Total Protein 7.4 6.0 - 8.5 g/dL   Albumin  4.8 4.1 - 5.2 g/dL   Globulin, Total 2.6 1.5 - 4.5 g/dL   Albumin/Globulin Ratio 1.8 1.2 - 2.2   Bilirubin Total 0.5 0.0 - 1.2 mg/dL   Alkaline Phosphatase 42 (L) 48 - 121 IU/L   AST 22 0 - 40 IU/L   ALT 25 0 - 44 IU/L  Lipid panel     Status: Abnormal   Collection Time: 12/22/19  9:24 AM  Result Value Ref Range   Cholesterol, Total 153 100 - 199 mg/dL   Triglycerides 162 (H) 0 - 149 mg/dL   HDL 35 (L) >39 mg/dL   VLDL Cholesterol Cal 28 5 - 40 mg/dL   LDL Chol Calc (NIH) 90 0 - 99 mg/dL   Chol/HDL Ratio 4.4 0.0 - 5.0 ratio    Comment:                                   T. Chol/HDL Ratio                                             Men  Women                               1/2 Avg.Risk  3.4    3.3                                   Avg.Risk  5.0    4.4                                2X Avg.Risk  9.6    7.1                                3X Avg.Risk 23.4   11.0       Psychiatric  Specialty Exam: Physical Exam  Review of Systems  Weight 228 lb (103.4 kg).There is no height or weight on file to calculate BMI.  General Appearance: NA  Eye Contact:  NA  Speech:  Clear and Coherent  Volume:  Normal  Mood:  Euthymic  Affect:  NA  Thought Process:  Goal Directed  Orientation:  Full (Time, Place, and Person)  Thought Content:  Logical  Suicidal Thoughts:  No  Homicidal Thoughts:  No  Memory:  Immediate;   Good Recent;   Good Remote;   Good  Judgement:  Good  Insight:  Present  Psychomotor Activity:  NA  Concentration:  Concentration: Good and Attention Span: Good  Recall:  Good  Fund of Knowledge:  Good  Language:  Good  Akathisia:  No  Handed:  Right  AIMS (if indicated):     Assets:  Communication Skills Desire for Improvement Housing Resilience Social Support Talents/Skills Transportation  ADL's:  Intact  Cognition:  WNL  Sleep:   ok     Assessment and Plan: Generalized anxiety disorder.  Social anxiety disorder.  Patient doing well on his medication.  He still have some time anxiety especially if he is going to public places and is working on his triggers.  He wants to keep his current medication.  We will continue Wellbutrin XL 300 mg daily.  I review blood work results from his recent primary care visit.  His labs are normal.  He has not taken Klonopin in a while.  Recommended to call us back if is any question or any concern.  Follow-up in 3 months.  Follow Up Instructions:    I discussed the assessment and treatment plan with the patient. The patient was provided an opportunity to ask questions and all were answered. The patient agreed with the plan and demonstrated an understanding of the instructions.   The patient was advised to call back or seek an in-person evaluation if the symptoms worsen or if the condition fails to improve as anticipated.  I provided 20 minutes of non-face-to-face time during this encounter.   [name],  MD

## 2020-04-13 ENCOUNTER — Other Ambulatory Visit: Payer: Self-pay

## 2020-04-13 ENCOUNTER — Encounter (HOSPITAL_COMMUNITY): Payer: Self-pay | Admitting: Psychiatry

## 2020-04-13 ENCOUNTER — Telehealth (INDEPENDENT_AMBULATORY_CARE_PROVIDER_SITE_OTHER): Payer: BLUE CROSS/BLUE SHIELD | Admitting: Psychiatry

## 2020-04-13 DIAGNOSIS — F4010 Social phobia, unspecified: Secondary | ICD-10-CM | POA: Diagnosis not present

## 2020-04-13 DIAGNOSIS — F411 Generalized anxiety disorder: Secondary | ICD-10-CM

## 2020-04-13 MED ORDER — BUPROPION HCL ER (XL) 300 MG PO TB24: 300.0000 mg | ORAL_TABLET | Freq: Every day | ORAL | 0 refills | Status: DC

## 2020-04-13 NOTE — Progress Notes (Signed)
Virtual Visit via Telephone Note  I connected with Michael Roman on 04/13/20 at  8:40 AM EST by telephone and verified that I am speaking with the correct person using two identifiers.  Location: Patient: Home Provider: Home Office   I discussed the limitations, risks, security and privacy concerns of performing an evaluation and management service by telephone and the availability of in person appointments. I also discussed with the patient that there may be a patient responsible charge related to this service. The patient expressed understanding and agreed to proceed.   History of Present Illness: Patient is evaluated by phone session. He is on the phone by himself. He is taking Wellbutrin every day. He feels his anxiety is stable. He also started therapy with Candace Cartner and that is also helping him a lot. 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 He is living with his parents. He still have some residual symptoms of anxiety when he go to public places but he does not feel overwhelmed. He does not leave the place but try to avoid people. Denies any panic attack. He is using coping skills to deal with the the incidences. He does not want to change the medication because it is working well. His appetite is okay. His energy level is good. He has no tremors, shakes or any EPS. His weight is stable. His energy level is good.   Past Psychiatric History: H/O anxiety and panic attacks.NoH/Osuicidal attempt, psychosis, maniaandhallucination.NoH/[place]use.Took Zoloft since fifth gradebut stopped working.    Past Psychiatric History: H/O anxiety and panic attacks.NoH/Osuicidal attempt, psychosis, maniaandhallucination.NoH/[place].Took Zoloft since fifth gradebut stopped working.  Psychiatric Specialty Exam: Physical Exam  Review of Systems  Weight 228 lb (103.4 kg).There is no height or weight on file to calculate BMI.  General Appearance: NA  Eye  Contact:  NA  Speech:  Normal Rate  Volume:  Normal  Mood:  Anxious  Affect:  NA  Thought Process:  Goal Directed  Orientation:  Full (Time, Place, and Person)  Thought Content:  WDL  Suicidal Thoughts:  No  Homicidal Thoughts:  No  Memory:  Immediate;   Good Recent;   Good Remote;   Good  Judgement:  Intact  Insight:  Good  Psychomotor Activity:  NA  Concentration:  Concentration: Good and Attention Span: Good  Recall:  Good  Fund of Knowledge:  Good  Language:  Good  Akathisia:  No  Handed:  Right  AIMS (if indicated):     Assets:  Communication Skills Desire for Improvement Housing Resilience Social Support Talents/Skills  ADL's:  Intact  Cognition:  WNL  Sleep:   ok      Assessment and Plan: Social anxiety disorder. Generalized anxiety disorder.  Patient is a stable on his current medication. Continue Wellbutrin XL 300 mg daily. Encouraged to continue therapy since together he is doing better and symptoms are under control. Recommended to call us back with any question or any concern. Follow-up in 3 months.  Follow Up Instructions:    I discussed the assessment and treatment plan with the patient. The patient was provided an opportunity to ask questions and all were answered. The patient agreed with the plan and demonstrated an understanding of the instructions.   The patient was advised to call back or seek an in-person evaluation if the symptoms worsen or if the condition fails to improve as anticipated.  I provided 9 minutes of non-face-to-face time during this encounter.   Arlyce Harman  Adele Schilder, MD

## 2020-06-25 ENCOUNTER — Ambulatory Visit: Payer: BLUE CROSS/BLUE SHIELD | Admitting: Nurse Practitioner

## 2020-06-25 ENCOUNTER — Other Ambulatory Visit: Payer: Self-pay

## 2020-06-25 ENCOUNTER — Encounter: Payer: Self-pay | Admitting: Nurse Practitioner

## 2020-06-25 VITALS — BP 140/90 | HR 99 | Temp 97.2°F | Resp 20 | Ht 69.0 in | Wt 230.0 lb

## 2020-06-25 DIAGNOSIS — F411 Generalized anxiety disorder: Secondary | ICD-10-CM | POA: Diagnosis not present

## 2020-06-25 DIAGNOSIS — E7800 Pure hypercholesterolemia, unspecified: Secondary | ICD-10-CM

## 2020-06-25 DIAGNOSIS — Z6833 Body mass index (BMI) 33.0-33.9, adult: Secondary | ICD-10-CM

## 2020-06-25 DIAGNOSIS — I10 Essential (primary) hypertension: Secondary | ICD-10-CM

## 2020-06-25 DIAGNOSIS — F321 Major depressive disorder, single episode, moderate: Secondary | ICD-10-CM | POA: Diagnosis not present

## 2020-06-25 MED ORDER — BUPROPION HCL ER (XL) 300 MG PO TB24: 300.0000 mg | ORAL_TABLET | Freq: Every day | ORAL | 0 refills | Status: DC

## 2020-06-25 MED ORDER — HYDROCHLOROTHIAZIDE 12.5 MG PO CAPS
12.5000 mg | ORAL_CAPSULE | Freq: Every day | ORAL | 1 refills | Status: DC
Start: 2020-06-25 — End: 2021-04-03

## 2020-06-25 NOTE — Progress Notes (Signed)
Subjective:    Patient ID: Michael Roman, male    DOB: October 18, 1990, 30 y.o.   MRN: [id number]   Chief Complaint: Medical Management of Chronic Issues    HPI:  1. Essential hypertension No c/o chest pain, sob or headache. Does not check blood pressure at home. BP Readings from Last 3 Encounters:  06/25/20 140/90  12/22/19 (!) 136/97  12/21/18 140/82     2. Pure hypercholesterolemia Does try to watch diet and does some exercise. Lab Results  Component Value Date   CHOL 153 12/22/2019   HDL 35 (L) 12/22/2019   LDLCALC 90 12/22/2019   TRIG 162 (H) 12/22/2019   CHOLHDL 4.4 12/22/2019   3. Depression, major, single episode, moderate ([place]) Is still on wellbutrin and is doing well. Depression screen [facility] 2/9 06/25/2020 12/22/2019 03/22/2019  Decreased Interest 0 0 0  Down, Depressed, Hopeless 0 0 0  PHQ - 2 Score 0 0 0  Altered sleeping 0 0 -  Tired, decreased energy 0 0 -  Change in appetite 0 0 -  Feeling bad or failure about yourself  0 0 -  Trouble concentrating 0 0 -  Moving slowly or fidgety/restless 0 0 -  Suicidal thoughts 0 0 -  PHQ-9 Score 0 0 -  Difficult doing work/chores Not difficult at all Not difficult at all -      4. Generalized anxiety disorder Has had no panic attacks. GAD 7 : Generalized Anxiety Score 06/25/2020 12/22/2019 03/22/2019 10/04/2015  Nervous, Anxious, on Edge 0 0 1 0  Control/stop worrying 0 0 1 0  Worry too much - different things 0 0 1 0  Trouble relaxing 0 0 0 0  Restless 0 0 0 0  Easily annoyed or irritable 0 0 0 0  Afraid - awful might happen 0 0 0 0  Total GAD 7 Score 0 0 3 0  Anxiety Difficulty Not difficult at all Not difficult at all Not difficult at all -      5. Morbid obesity with BMI of 33.0-33.9, adult ([place]) Weight is unchanged Wt Readings from Last 3 Encounters:  06/25/20 230 lb (104.3 kg)  12/22/19 228 lb (103.4 kg)  12/21/18 245 lb (111.1 kg)   BMI Readings from Last 3 Encounters:  06/25/20 33.97 kg/m   12/22/19 33.67 kg/m  12/21/18 36.18 kg/m       Outpatient Encounter Medications as of 06/25/2020  Medication Sig  . buPROPion (WELLBUTRIN XL) 300 MG 24 hr tablet Take 1 tablet (300 mg total) by mouth daily.  . cetirizine (ZYRTEC) 10 MG tablet Take 10 mg by mouth 3 times/day as needed-between meals & bedtime for allergies.  . hydrochlorothiazide (MICROZIDE) 12.5 MG capsule Take 1 capsule (12.5 mg total) by mouth daily.     Past Surgical History:  Procedure Laterality Date  . SHOULDER ARTHROSCOPY W/ LABRAL REPAIR     x2  . WISDOM TOOTH EXTRACTION      Family History  Problem Relation Age of Onset  . Hypertension Mother   . Hypertension Father   . Colon cancer Neg Hx   . Stomach cancer Neg Hx     New complaints: None today  Social history: Lives with is parents  Controlled substance contract: n/a    Review of Systems  Constitutional: Negative for diaphoresis.  Eyes: Negative for pain.  Respiratory: Negative for shortness of breath.   Cardiovascular: Negative for chest pain, palpitations and leg swelling.  Gastrointestinal: Negative for abdominal pain.  Endocrine: Negative  for polydipsia.  Skin: Negative for rash.  Neurological: Negative for dizziness, weakness and headaches.  Hematological: Does not bruise/bleed easily.  All other systems reviewed and are negative.      Objective:   Physical Exam Vitals and nursing note reviewed.  Constitutional:      Appearance: Normal appearance. He is well-developed and well-nourished.  HENT:     Head: Normocephalic.     Nose: Nose normal.     Mouth/Throat:     Mouth: Oropharynx is clear and moist.  Eyes:     Extraocular Movements: EOM normal.     Pupils: Pupils are equal, round, and reactive to light.  Neck:     Thyroid: No thyroid mass or thyromegaly.     Vascular: No carotid bruit or JVD.     Trachea: Phonation normal.  Cardiovascular:     Rate and Rhythm: Normal rate and regular rhythm.  Pulmonary:      Effort: Pulmonary effort is normal. No respiratory distress.     Breath sounds: Normal breath sounds.  Abdominal:     General: Bowel sounds are normal. Aorta is normal.     Palpations: Abdomen is soft.     Tenderness: There is no abdominal tenderness.  Musculoskeletal:        General: Normal range of motion.     Cervical back: Normal range of motion and neck supple.  Lymphadenopathy:     Cervical: No cervical adenopathy.  Skin:    General: Skin is warm and dry.  Neurological:     Mental Status: He is alert and oriented to person, place, and time.  Psychiatric:        Mood and Affect: Mood and affect normal.        Behavior: Behavior normal.        Thought Content: Thought content normal.        Judgment: Judgment normal.    BP 140/90   Pulse 99   Temp (!) 97.2 F (36.2 C) (Temporal)   Resp 20   Ht 5' 9"  (1.753 m)   Wt 230 lb (104.3 kg)   SpO2 100%   BMI 33.97 kg/m         Assessment & Plan:  Michael Roman comes in today with chief complaint of Medical Management of Chronic Issues   Diagnosis and orders addressed:  1. Essential hypertension Low sodium diet - hydrochlorothiazide (MICROZIDE) 12.5 MG capsule; Take 1 capsule (12.5 mg total) by mouth daily.  Dispense: 90 capsule; Refill: 1 - CBC with Differential/Platelet - CMP14+EGFR  2. Pure hypercholesterolemia Low fat diet - Lipid panel  3. Depression, major, single episode, moderate ([place]) Stress management  4. Generalized anxiety disorder - buPROPion (WELLBUTRIN XL) 300 MG 24 hr tablet; Take 1 tablet (300 mg total) by mouth daily.  Dispense: 90 tablet; Refill: 0  5. Morbid obesity with BMI of 40.0-44.9, adult ([place]) Discussed diet and exercise for person with BMI >25 Will recheck weight in 3-6 months   Labs pending Health Maintenance reviewed Diet and exercise encouraged  Follow up plan: 6 months   Mary-Margaret Hassell Done, FNP

## 2020-06-25 NOTE — Patient Instructions (Signed)

## 2020-06-26 LAB — CMP14+EGFR
ALT: 24 IU/L (ref 0–44)
AST: 22 IU/L (ref 0–40)
Albumin/Globulin Ratio: 1.6 (ref 1.2–2.2)
Albumin: 5 g/dL (ref 4.1–5.2)
Alkaline Phosphatase: 45 IU/L (ref 44–121)
BUN/Creatinine Ratio: 10 (ref 9–20)
BUN: 12 mg/dL (ref 6–20)
Bilirubin Total: 0.5 mg/dL (ref 0.0–1.2)
CO2: 23 mmol/L (ref 20–29)
Calcium: 10 mg/dL (ref 8.7–10.2)
Chloride: 103 mmol/L (ref 96–106)
Creatinine, Ser: 1.23 mg/dL (ref 0.76–1.27)
Globulin, Total: 3.2 g/dL (ref 1.5–4.5)
Glucose: 95 mg/dL (ref 65–99)
Potassium: 4.7 mmol/L (ref 3.5–5.2)
Sodium: 142 mmol/L (ref 134–144)
Total Protein: 8.2 g/dL (ref 6.0–8.5)
eGFR: 83 mL/min/{1.73_m2} (ref >59)

## 2020-06-26 LAB — CBC WITH DIFFERENTIAL/PLATELET
Basophils Absolute: 0.1 10*3/uL (ref 0.0–0.2)
Basos: 1 %
EOS (ABSOLUTE): 0.3 10*3/uL (ref 0.0–0.4)
Eos: 4 %
Hematocrit: 47.4 % (ref 37.5–51.0)
Hemoglobin: 16.2 g/dL (ref 13.0–17.7)
Immature Grans (Abs): 0 10*3/uL (ref 0.0–0.1)
Immature Granulocytes: 0 %
Lymphocytes Absolute: 2.9 10*3/uL (ref 0.7–3.1)
Lymphs: 40 %
MCH: 30.1 pg (ref 26.6–33.0)
MCHC: 34.2 g/dL (ref 31.5–35.7)
MCV: 88 fL (ref 79–97)
Monocytes Absolute: 0.5 10*3/uL (ref 0.1–0.9)
Monocytes: 7 %
Neutrophils Absolute: 3.6 10*3/uL (ref 1.4–7.0)
Neutrophils: 48 %
Platelets: 331 10*3/uL (ref 150–450)
RBC: 5.39 x10E6/uL (ref 4.14–5.80)
RDW: 12.7 % (ref 11.6–15.4)
WBC: 7.3 10*3/uL (ref 3.4–10.8)

## 2020-06-26 LAB — LIPID PANEL
Chol/HDL Ratio: 4.6 ratio (ref 0.0–5.0)
Cholesterol, Total: 178 mg/dL (ref 100–199)
HDL: 39 mg/dL — ABNORMAL LOW (ref >39)
LDL Chol Calc (NIH): 121 mg/dL — ABNORMAL HIGH (ref 0–99)
Triglycerides: 100 mg/dL (ref 0–149)
VLDL Cholesterol Cal: 18 mg/dL (ref 5–40)

## 2020-07-10 ENCOUNTER — Encounter (HOSPITAL_COMMUNITY): Payer: Self-pay | Admitting: Psychiatry

## 2020-07-10 ENCOUNTER — Other Ambulatory Visit: Payer: Self-pay

## 2020-07-10 ENCOUNTER — Telehealth (INDEPENDENT_AMBULATORY_CARE_PROVIDER_SITE_OTHER): Payer: BLUE CROSS/BLUE SHIELD | Admitting: Psychiatry

## 2020-07-10 VITALS — Wt 230.0 lb

## 2020-07-10 DIAGNOSIS — F410 Panic disorder [episodic paroxysmal anxiety] without agoraphobia: Secondary | ICD-10-CM

## 2020-07-10 DIAGNOSIS — F4010 Social phobia, unspecified: Secondary | ICD-10-CM

## 2020-07-10 MED ORDER — BUPROPION HCL ER (XL) 300 MG PO TB24: 300.0000 mg | ORAL_TABLET | Freq: Every day | ORAL | 0 refills | Status: DC

## 2020-07-10 NOTE — Progress Notes (Signed)
Virtual Visit via Telephone Note  I connected with Michael Roman on 07/10/20 at  8:40 AM EDT by telephone and verified that I am speaking with the correct person using two identifiers.  Location: Patient: Home Provider: Home Office   I discussed the limitations, risks, security and privacy concerns of performing an evaluation and management service by telephone and the availability of in person appointments. I also discussed with the patient that there may be a patient responsible charge related to this service. The patient expressed understanding and agreed to proceed.   History of Present Illness: Patient is evaluated by phone session.  He is taking Wellbutrin at that is working well.  He reported his anxiety is stable and manageable.  He does not avoid public places but also does not feel very overwhelmed which he used to.  He denies any major panic attack in recent months.  He is sleeping good.  His job is going well.  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  He lives with his parents but now he is start looking for a house but he reported houses are very expensive and he is not sure when he will get the house.  His appetite is okay.  He has no tremors shakes or any EPS.  He recently had blood work and his LDL is high.  He also gained 3 pounds from the past.  He is now doing regular exercise try to lose weight.  Does not want to change the medication.  He has not taken Klonopin in more than a year.  He denies drinking or using any illegal substances.  Past Psychiatric History: H/O anxiety and panic attacks.NoH/Osuicidal attempt, psychosis, maniaandhallucination.NoH/Oabuse.Took Zoloft since fifth gradebut stopped working.  Recent Results (from the past 2160 hour(s))  CBC with Differential/Platelet     Status: None   Collection Time: 06/25/20 10:21 AM  Result Value Ref Range   WBC 7.3 3.4 - 10.8 x10E3/uL   RBC 5.39 4.14 - 5.80 x10E6/uL   Hemoglobin 16.2 13.0 - 17.7 g/dL    Hematocrit 47.4 37.5 - 51.0 %   MCV 88 79 - 97 fL   MCH 30.1 26.6 - 33.0 pg   MCHC 34.2 31.5 - 35.7 g/dL   RDW 12.7 11.6 - 15.4 %   Platelets 331 150 - 450 x10E3/uL   Neutrophils 48 Not Estab. %   Lymphs 40 Not Estab. %   Monocytes 7 Not Estab. %   Eos 4 Not Estab. %   Basos 1 Not Estab. %   Neutrophils Absolute 3.6 1.4 - 7.0 x10E3/uL   Lymphocytes Absolute 2.9 0.7 - 3.1 x10E3/uL   Monocytes Absolute 0.5 0.1 - 0.9 x10E3/uL   EOS (ABSOLUTE) 0.3 0.0 - 0.4 x10E3/uL   Basophils Absolute 0.1 0.0 - 0.2 x10E3/uL   Immature Granulocytes 0 Not Estab. %   Immature Grans (Abs) 0.0 0.0 - 0.1 x10E3/uL  CMP14+EGFR     Status: None   Collection Time: 06/25/20 10:21 AM  Result Value Ref Range   Glucose 95 65 - 99 mg/dL   BUN 12 6 - 20 mg/dL   Creatinine, Ser 1.23 0.76 - 1.27 mg/dL   eGFR 83 >59 mL/min/1.73    Comment: **In accordance with recommendations from the NKF-ASN Task force,**   [facility] has updated its eGFR calculation to the 2021 CKD-EPI   creatinine equation that estimates kidney function without a race   variable.    BUN/Creatinine Ratio 10 9 - 20   Sodium 142  134 - 144 mmol/L   Potassium 4.7 3.5 - 5.2 mmol/L   Chloride 103 96 - 106 mmol/L   CO2 23 20 - 29 mmol/L   Calcium 10.0 8.7 - 10.2 mg/dL   Total Protein 8.2 6.0 - 8.5 g/dL   Albumin 5.0 4.1 - 5.2 g/dL   Globulin, Total 3.2 1.5 - 4.5 g/dL   Albumin/Globulin Ratio 1.6 1.2 - 2.2   Bilirubin Total 0.5 0.0 - 1.2 mg/dL   Alkaline Phosphatase 45 44 - 121 IU/L   AST 22 0 - 40 IU/L   ALT 24 0 - 44 IU/L  Lipid panel     Status: Abnormal   Collection Time: 06/25/20 10:21 AM  Result Value Ref Range   Cholesterol, Total 178 100 - 199 mg/dL   Triglycerides 100 0 - 149 mg/dL   HDL 39 (L) >39 mg/dL   VLDL Cholesterol Cal 18 5 - 40 mg/dL   LDL Chol Calc (NIH) 121 (H) 0 - 99 mg/dL   Chol/HDL Ratio 4.6 0.0 - 5.0 ratio    Comment:                                   T. Chol/HDL Ratio                                             Men   Women                               1/2 Avg.Risk  3.4    3.3                                   Avg.Risk  5.0    4.4                                2X Avg.Risk  9.6    7.1                                3X Avg.Risk 23.4   11.0      Psychiatric Specialty Exam: Physical Exam  Review of Systems  Weight 230 lb (104.3 kg).There is no height or weight on file to calculate BMI.  General Appearance: NA  Eye Contact:  NA  Speech:  Clear and Coherent  Volume:  Normal  Mood:  Euthymic  Affect:  NA  Thought Process:  Goal Directed  Orientation:  Full (Time, Place, and Person)  Thought Content:  Logical  Suicidal Thoughts:  No  Homicidal Thoughts:  No  Memory:  Immediate;   Good Recent;   Good Remote;   Good  Judgement:  Good  Insight:  Good  Psychomotor Activity:  NA  Concentration:  Concentration: Good and Attention Span: Good  Recall:  Good  Fund of Knowledge:  Good  Language:  Good  Akathisia:  No  Handed:  Right  AIMS (if indicated):     Assets:  Communication Skills Desire for Improvement Housing Resilience Social Support Talents/Skills Transportation  ADL's:  Intact  Cognition:  WNL  Sleep:  ok      Assessment and Plan: Social anxiety disorder.  Panic attacks.  Patient is a stable on his current medication.  His anxiety is manageable.  He had not taken Klonopin for more than a year and his panic attacks are not as intense.  I reviewed blood work results with him.  He will start exercise to lose his weight gain.  Continue Wellbutrin XL 300 mg daily.  He is in therapy with therapist which is also helping his coping skills.  Discussed medication side effects and benefits.  Recommended to call us back if is any question or any concern.  Follow-up in 3 months.  Follow Up Instructions:    I discussed the assessment and treatment plan with the patient. The patient was provided an opportunity to ask questions and all were answered. The patient agreed with the plan and  demonstrated an understanding of the instructions.   The patient was advised to call back or seek an in-person evaluation if the symptoms worsen or if the condition fails to improve as anticipated.  I provided 14 minutes of non-face-to-face time during this encounter.   [name], MD

## 2020-08-25 DIAGNOSIS — R21 Rash and other nonspecific skin eruption: Secondary | ICD-10-CM | POA: Diagnosis not present

## 2020-08-25 DIAGNOSIS — Z6833 Body mass index (BMI) 33.0-33.9, adult: Secondary | ICD-10-CM | POA: Diagnosis not present

## 2020-10-10 ENCOUNTER — Other Ambulatory Visit: Payer: Self-pay

## 2020-10-10 ENCOUNTER — Encounter (HOSPITAL_COMMUNITY): Payer: Self-pay | Admitting: Psychiatry

## 2020-10-10 ENCOUNTER — Telehealth (INDEPENDENT_AMBULATORY_CARE_PROVIDER_SITE_OTHER): Payer: BLUE CROSS/BLUE SHIELD | Admitting: Psychiatry

## 2020-10-10 VITALS — Wt 230.0 lb

## 2020-10-10 DIAGNOSIS — F410 Panic disorder [episodic paroxysmal anxiety] without agoraphobia: Secondary | ICD-10-CM

## 2020-10-10 DIAGNOSIS — F4010 Social phobia, unspecified: Secondary | ICD-10-CM

## 2020-10-10 MED ORDER — BUPROPION HCL ER (XL) 300 MG PO TB24: 300.0000 mg | ORAL_TABLET | Freq: Every day | ORAL | 0 refills | Status: DC

## 2020-10-10 NOTE — Progress Notes (Signed)
Virtual Visit via Telephone Note  I connected with Michael Roman on 10/10/20 at  8:20 AM EDT by telephone and verified that I am speaking with the correct person using two identifiers.  Location: Patient: Work Provider: Biomedical scientist   I discussed the limitations, risks, security and privacy concerns of performing an evaluation and management service by telephone and the availability of in person appointments. I also discussed with the patient that there may be a patient responsible charge related to this service. The patient expressed understanding and agreed to proceed.   History of Present Illness: Patient is evaluated by phone session.  He is taking his medication as prescribed.  He feels the medicine helping him and his anxiety and panic attacks are manageable.  He admitted occasionally he feels nervous when he goes to public places but able to calm him down.  He has not taken Klonopin for more than a year.  He is sleeping good.  His job is going well.  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  He is disappointed as not able to find a house and still living with his parents but hoping to find a house soon if crisis of the house go down.  His appetite is okay.  His weight is stable.  He is in therapy with Ecologist at [facility] in [place] every 2 weeks.  He denies drinking or using any illegal substances.  He wants to keep his current medication.   Past Psychiatric History:  H/O anxiety and panic attacks. No H/O suicidal attempt, psychosis, mania and hallucination. No H/O abuse. Took Zoloft since fifth grade but stopped working.  Klonopin helped.  Psychiatric Specialty Exam: Physical Exam  Review of Systems  Weight 230 lb (104.3 kg).There is no height or weight on file to calculate BMI.  General Appearance: NA  Eye Contact:  NA  Speech:  Normal Rate  Volume:  Normal  Mood:  Euthymic  Affect:  NA  Thought Process:  Goal Directed   Orientation:  Full (Time, Place, and Person)  Thought Content:  WDL  Suicidal Thoughts:  No  Homicidal Thoughts:  No  Memory:  Immediate;   Good Recent;   Good Remote;   Good  Judgement:  Intact  Insight:  Good  Psychomotor Activity:  NA  Concentration:  Concentration: Good and Attention Span: Good  Recall:  Good  Fund of Knowledge:  Good  Language:  Good  Akathisia:  No  Handed:  Right  AIMS (if indicated):     Assets:  Communication Skills Desire for [place] Talents/Skills Transportation  ADL's:  Intact  Cognition:  WNL  Sleep:   ok      Assessment and Plan: Social anxiety disorder.  Panic attacks.  Patient is stable on Wellbutrin XL 300 mg daily.  He has no tremor or shakes or any EPS.  Discussed medication side effects and benefits.  Patient is in therapy with Candace Partner every 2 weeks.  Recommended to call us back if is any question or any concern.  Follow-up in 3 months.  Follow Up Instructions:    I discussed the assessment and treatment plan with the patient. The patient was provided an opportunity to ask questions and all were answered. The patient agreed with the plan and demonstrated an understanding of the instructions.   The patient was advised to call back or seek an in-person evaluation if the symptoms worsen  or if the condition fails to improve as anticipated.  I provided 12 minutes of non-face-to-face time during this encounter.   [name], MD

## 2021-01-10 ENCOUNTER — Other Ambulatory Visit: Payer: Self-pay

## 2021-01-10 ENCOUNTER — Telehealth (INDEPENDENT_AMBULATORY_CARE_PROVIDER_SITE_OTHER): Payer: BLUE CROSS/BLUE SHIELD | Admitting: Psychiatry

## 2021-01-10 ENCOUNTER — Encounter (HOSPITAL_COMMUNITY): Payer: Self-pay | Admitting: Psychiatry

## 2021-01-10 DIAGNOSIS — F410 Panic disorder [episodic paroxysmal anxiety] without agoraphobia: Secondary | ICD-10-CM

## 2021-01-10 DIAGNOSIS — F4010 Social phobia, unspecified: Secondary | ICD-10-CM

## 2021-01-10 MED ORDER — BUPROPION HCL ER (XL) 300 MG PO TB24: 300.0000 mg | ORAL_TABLET | Freq: Every day | ORAL | 0 refills | Status: DC

## 2021-01-10 NOTE — Progress Notes (Signed)
Virtual Visit via Telephone Note  I connected with Michael Roman on 01/10/21 at  8:20 AM EDT by telephone and verified that I am speaking with the correct person using two identifiers.  Location: Patient: Work Provider: Biomedical scientist   I discussed the limitations, risks, security and privacy concerns of performing an evaluation and management service by telephone and the availability of in person appointments. I also discussed with the patient that there may be a patient responsible charge related to this service. The patient expressed understanding and agreed to proceed.   History of Present Illness: Patient is evaluated by phone session.  He is taking his medication as prescribed.  He feels his social anxiety and nervousness is much better and he is able to enjoy going outside.  In the summer he went to the mountains and had a good time.  Sometimes he struggles with attention and focus and his therapist discussed to have the diagnosis of possible ADD, inattentive type.  Patient recall the school was good but in the college he struggle with the classes.  He is wondering if he has ADHD.  He feels that attention and focus got much better since he started taking the Wellbutrin.  His appetite is okay.  His job is going well and he has no issue at work.  Patient works as a Airline pilot.  His weight is stable.  Denies drinking or using any illegal substances.  He has no tremor or shakes or any EPS.   Past Psychiatric History:  H/O anxiety and panic attacks. No H/O suicidal attempt, psychosis, mania and hallucination. No H/O abuse. Took Zoloft since fifth grade but stopped working.  Klonopin helped.   Psychiatric Specialty Exam: Physical Exam  Review of Systems  Weight 230 lb (104.3 kg).There is no height or weight on file to calculate BMI.  General Appearance: NA  Eye Contact:  NA  Speech:  Clear and Coherent  Volume:  Normal  Mood:  Euthymic  Affect:  NA  Thought Process:  Goal Directed   Orientation:  Full (Time, Place, and Person)  Thought Content:  WDL  Suicidal Thoughts:  No  Homicidal Thoughts:  No  Memory:  Immediate;   Good Recent;   Good Remote;   Good  Judgement:  Intact  Insight:  Present  Psychomotor Activity:  NA  Concentration:  Concentration: Fair and Attention Span: Good  Recall:  Good  Fund of Knowledge:  Good  Language:  Good  Akathisia:  No  Handed:  Right  AIMS (if indicated):     Assets:  Communication Skills Desire for Improvement Housing Social Support Talents/Skills Transportation  ADL's:  Intact  Cognition:  WNL  Sleep:         Assessment and Plan: Social anxiety disorder.  Panic attacks.  I discussed in detail about possible ADD symptoms and recommended to need to establish the diagnosis he should consider having psychological testing.  We also talked about stimulants, nonstimulants and Wellbutrin as a better medicine to help anxiety and attention and focus.  At this time patient does not want to change the medication but like to have a referral for psychological testing.  I encouraged to continue therapy with Delilah Shan which she has been seeing since 2018.  Continue Wellbutrin XL 300 mg daily.  Recommended to call us back if there is any question of any concern.  We will refer her for psychological testing.  Follow-up in 3 months.  Follow Up Instructions:    I discussed  the assessment and treatment plan with the patient. The patient was provided an opportunity to ask questions and all were answered. The patient agreed with the plan and demonstrated an understanding of the instructions.   The patient was advised to call back or seek an in-person evaluation if the symptoms worsen or if the condition fails to improve as anticipated.  I provided 18 minutes of non-face-to-face time during this encounter.   [name], MD

## 2021-01-16 ENCOUNTER — Telehealth (HOSPITAL_COMMUNITY): Payer: Self-pay | Admitting: *Deleted

## 2021-01-16 NOTE — Telephone Encounter (Signed)
Referral submitted to [facility] for ADHD evaluation.

## 2021-02-12 DIAGNOSIS — F909 Attention-deficit hyperactivity disorder, unspecified type: Secondary | ICD-10-CM | POA: Diagnosis not present

## 2021-02-12 DIAGNOSIS — I10 Essential (primary) hypertension: Secondary | ICD-10-CM | POA: Diagnosis not present

## 2021-02-12 DIAGNOSIS — Z79899 Other long term (current) drug therapy: Secondary | ICD-10-CM | POA: Diagnosis not present

## 2021-04-03 ENCOUNTER — Encounter (HOSPITAL_COMMUNITY): Payer: Self-pay | Admitting: Psychiatry

## 2021-04-03 ENCOUNTER — Other Ambulatory Visit: Payer: Self-pay

## 2021-04-03 ENCOUNTER — Telehealth (HOSPITAL_BASED_OUTPATIENT_CLINIC_OR_DEPARTMENT_OTHER): Payer: BLUE CROSS/BLUE SHIELD | Admitting: Psychiatry

## 2021-04-03 VITALS — Wt 230.0 lb

## 2021-04-03 DIAGNOSIS — F410 Panic disorder [episodic paroxysmal anxiety] without agoraphobia: Secondary | ICD-10-CM

## 2021-04-03 DIAGNOSIS — F4010 Social phobia, unspecified: Secondary | ICD-10-CM

## 2021-04-03 NOTE — Progress Notes (Signed)
Virtual Visit via Telephone Note  I connected with Michael Roman on 04/03/21 at  8:40 AM EST by telephone and verified that I am speaking with the correct person using two identifiers.  Location: Patient: Work Provider: Biomedical scientist   I discussed the limitations, risks, security and privacy concerns of performing an evaluation and management service by telephone and the availability of in person appointments. I also discussed with the patient that there may be a patient responsible charge related to this service. The patient expressed understanding and agreed to proceed.   History of Present Illness: Patient is evaluated by phone session.  We have referred him for psychological testing but patient contacted his PCP who recommended to keep the Wellbutrin since it is working.  His PCP Lorenza Evangelist increased the Wellbutrin and now he is taking 450 mg a day.  He noticed increase Wellbutrin had helped some of his hyperactivity and he does not get distracted easily and he like to keep the current dose.  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  Denies any major panic attack or any significant worsening of anxiety.  He is comfortable around people.  He is able to enjoy things outside.  His appetite is okay.  He has no tremor or shakes or any EPS.  He sleeps good.  He had a good Thanksgiving.  He preferred to stay with the PCP since it is closer to his home and workplace.    Past Psychiatric History:  H/O anxiety and panic attacks. Denies h/o suicidal attempt, psychosis, mania, hallucination and abuse. Took Zoloft since fifth grade but stopped working.  Klonopin helped.  Psychiatric Specialty Exam: Physical Exam  Review of Systems  Weight 230 lb (104.3 kg).There is no height or weight on file to calculate BMI.  General Appearance: NA  Eye Contact:  NA  Speech:  Clear and Coherent and Normal Rate  Volume:  Normal  Mood:  Euthymic  Affect:  NA  Thought Process:  Goal Directed   Orientation:  Full (Time, Place, and Person)  Thought Content:  WDL  Suicidal Thoughts:  No  Homicidal Thoughts:  No  Memory:  Immediate;   Good Recent;   Good Remote;   Good  Judgement:  Intact  Insight:  Good  Psychomotor Activity:  NA  Concentration:  Concentration: Good and Attention Span: Good  Recall:  Good  Fund of Knowledge:  Good  Language:  Good  Akathisia:  No  Handed:  Right  AIMS (if indicated):     Assets:  Communication Skills Desire for Improvement Housing Resilience Social Support Talents/Skills Transportation  ADL's:  Intact  Cognition:  WNL  Sleep:   ok      Assessment and Plan: Social anxiety disorder.  Panic attacks.  Patient is now taking Wellbutrin XL 150 mg in addition with 300 mg.  So far he has no side effects including tremor or shakes or any EPS.  He like to stay with his new PCP which is closer to his house and work.  He is in therapy with Delilah Shan.  He liked to call us back if symptoms started to get worse or if he needed a different medication.  I agree that he should stay with one provider who can manage the medication and if needed any help in the future then he can call us back.  We will not schedule any appointment in the future but if needed he can call to schedule appointment.  Follow Up Instructions:    I discussed the assessment and treatment plan with the patient. The patient was provided an opportunity to ask questions and all were answered. The patient agreed with the plan and demonstrated an understanding of the instructions.   The patient was advised to call back or seek an in-person evaluation if the symptoms worsen or if the condition fails to improve as anticipated.  I provided 17 minutes of non-face-to-face time during this encounter.   [name], MD
